# Patient Record
Sex: Female | Born: 1995 | Race: White | Hispanic: No | Marital: Married | Smoking: Never smoker
Health system: Southern US, Community
[De-identification: ages and names within clinical notes are randomized; demographics above are authoritative.]

## PROBLEM LIST (undated history)

## (undated) ENCOUNTER — Emergency Department (HOSPITAL_COMMUNITY): Discharge status: Absent without leave | Payer: BC Managed Care – PPO

## (undated) DIAGNOSIS — M5126 Other intervertebral disc displacement, lumbar region: Secondary | ICD-10-CM

## (undated) DIAGNOSIS — Z789 Other specified health status: Secondary | ICD-10-CM

## (undated) HISTORY — PX: WISDOM TOOTH EXTRACTION: SHX21

## (undated) HISTORY — DX: Other intervertebral disc displacement, lumbar region: M51.26

## (undated) HISTORY — DX: Other specified health status: Z78.9

---
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
## 2017-10-07 ENCOUNTER — Encounter: Payer: Self-pay | Admitting: Adult Health

## 2017-10-07 ENCOUNTER — Ambulatory Visit (INDEPENDENT_AMBULATORY_CARE_PROVIDER_SITE_OTHER): Payer: No Typology Code available for payment source | Admitting: Adult Health

## 2017-10-07 VITALS — BP 90/60 | HR 97 | Ht 62.0 in | Wt 159.4 lb

## 2017-10-07 DIAGNOSIS — Z3009 Encounter for other general counseling and advice on contraception: Secondary | ICD-10-CM

## 2017-10-07 DIAGNOSIS — Z3202 Encounter for pregnancy test, result negative: Secondary | ICD-10-CM

## 2017-10-07 LAB — POCT URINE PREGNANCY: Preg Test, Ur: NEGATIVE

## 2017-10-07 NOTE — Progress Notes (Signed)
Subjective:     Patient ID: Stefani Damahasity B Bailey, female   DOB: 12/23/1995, 21 y.o.   MRN: [id number]  HPI Forde DandyChasity is a 21 year old white female in to discuss birth control options.She is having sex with condoms.   Review of Systems  Having sex with condoms  Reviewed past medical,surgical, social and family history. Reviewed medications and allergies.     Objective:   Physical Exam BP 90/60 (BP Location: Right Arm, Patient Position: Sitting, Cuff Size: Small)   Pulse 97   Ht 5\' 2"  (1.575 m)   Wt 159 lb 6.4 oz (72.3 kg)   LMP 09/13/2017   BMI 29.15 kg/m UPT negative.Skin warm and dry.  Lungs: clear to ausculation bilaterally. Cardiovascular: regular rate and rhythm. PHQ 2 score 0. Discussed OCs,depo, IUD and nexplanon and she wants nexplanon.    Assessment:     1. Contraceptive education   2. Pregnancy examination or test, negative result       Plan:     Check stat QHCG 12/12 in am Order nexplanon today No sex after 10/12/17 Return 12/12 in pm for nexplanon insertion  Review handout on nexplanon

## 2017-10-07 NOTE — Patient Instructions (Signed)
Get labs 11/12 in am No sex after 10/12/17 Return 12/12 in pm for nexplanon insertion Etonogestrel implant What is this medicine? ETONOGESTREL (et oh noe JES trel) is a contraceptive (birth control) device. It is used to prevent pregnancy. It can be used for up to 3 years. This medicine may be used for other purposes; ask your health care provider or pharmacist if you have questions. COMMON BRAND NAME(S): Implanon, Nexplanon What should I tell my health care provider before I take this medicine? They need to know if you have any of these conditions: -abnormal vaginal bleeding -blood vessel disease or blood clots -cancer of the breast, cervix, or liver -depression -diabetes -gallbladder disease -headaches -heart disease or recent heart attack -high blood pressure -high cholesterol -kidney disease -liver disease -renal disease -seizures -tobacco smoker -an unusual or allergic reaction to etonogestrel, other hormones, anesthetics or antiseptics, medicines, foods, dyes, or preservatives -pregnant or trying to get pregnant -breast-feeding How should I use this medicine? This device is inserted just under the skin on the inner side of your upper arm by a health care professional. Talk to your pediatrician regarding the use of this medicine in children. Special care may be needed. Overdosage: If you think you have taken too much of this medicine contact a poison control center or emergency room at once. NOTE: This medicine is only for you. Do not share this medicine with others. What if I miss a dose? This does not apply. What may interact with this medicine? Do not take this medicine with any of the following medications: -amprenavir -bosentan -fosamprenavir This medicine may also interact with the following medications: -barbiturate medicines for inducing sleep or treating seizures -certain medicines for fungal infections like ketoconazole and itraconazole -grapefruit  juice -griseofulvin -medicines to treat seizures like carbamazepine, felbamate, oxcarbazepine, phenytoin, topiramate -modafinil -phenylbutazone -rifampin -rufinamide -some medicines to treat HIV infection like atazanavir, indinavir, lopinavir, nelfinavir, tipranavir, ritonavir -St. John's wort This list may not describe all possible interactions. Give your health care provider a list of all the medicines, herbs, non-prescription drugs, or dietary supplements you use. Also tell them if you smoke, drink alcohol, or use illegal drugs. Some items may interact with your medicine. What should I watch for while using this medicine? This product does not protect you against HIV infection (AIDS) or other sexually transmitted diseases. You should be able to feel the implant by pressing your fingertips over the skin where it was inserted. Contact your doctor if you cannot feel the implant, and use a non-hormonal birth control method (such as condoms) until your doctor confirms that the implant is in place. If you feel that the implant may have broken or become bent while in your arm, contact your healthcare provider. What side effects may I notice from receiving this medicine? Side effects that you should report to your doctor or health care professional as soon as possible: -allergic reactions like skin rash, itching or hives, swelling of the face, lips, or tongue -breast lumps -changes in emotions or moods -depressed mood -heavy or prolonged menstrual bleeding -pain, irritation, swelling, or bruising at the insertion site -scar at site of insertion -signs of infection at the insertion site such as fever, and skin redness, pain or discharge -signs of pregnancy -signs and symptoms of a blood clot such as breathing problems; changes in vision; chest pain; severe, sudden headache; pain, swelling, warmth in the leg; trouble speaking; sudden numbness or weakness of the face, arm or leg -signs and symptoms  of liver injury like dark yellow or brown urine; general ill feeling or flu-like symptoms; light-colored stools; loss of appetite; nausea; right upper belly pain; unusually weak or tired; yellowing of the eyes or skin -unusual vaginal bleeding, discharge -signs and symptoms of a stroke like changes in vision; confusion; trouble speaking or understanding; severe headaches; sudden numbness or weakness of the face, arm or leg; trouble walking; dizziness; loss of balance or coordination Side effects that usually do not require medical attention (report to your doctor or health care professional if they continue or are bothersome): -acne -back pain -breast pain -changes in weight -dizziness -general ill feeling or flu-like symptoms -headache -irregular menstrual bleeding -nausea -sore throat -vaginal irritation or inflammation This list may not describe all possible side effects. Call your doctor for medical advice about side effects. You may report side effects to FDA at 1-800-FDA-1088. Where should I keep my medicine? This drug is given in a hospital or clinic and will not be stored at home. NOTE: This sheet is a summary. It may not cover all possible information. If you have questions about this medicine, talk to your doctor, pharmacist, or health care provider.  2018 Elsevier/Gold Standard (2016-05-23 11:19:22)

## 2017-10-29 ENCOUNTER — Encounter: Payer: No Typology Code available for payment source | Admitting: Adult Health

## 2017-11-20 ENCOUNTER — Emergency Department (HOSPITAL_COMMUNITY)
Admission: EM | Admit: 2017-11-20 | Discharge: 2017-11-20 | Disposition: A | Discharge status: Home / Self Care | Payer: Self-pay | Attending: Emergency Medicine | Admitting: Emergency Medicine

## 2017-11-20 ENCOUNTER — Encounter (HOSPITAL_COMMUNITY): Payer: Self-pay | Admitting: *Deleted

## 2017-11-20 DIAGNOSIS — Z041 Encounter for examination and observation following transport accident: Secondary | ICD-10-CM | POA: Insufficient documentation

## 2017-11-20 MED ORDER — IBUPROFEN 400 MG PO TABS
400.0000 mg | ORAL_TABLET | Freq: Once | ORAL | Status: DC | PRN
  Filled 2017-11-20: qty 1
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
## 2017-11-20 NOTE — ED Notes (Signed)
Pt c/o back pain to registration clerk. Brought patient back for re-eval, ambulatory to triage, stating back pain is coming and going. Offered protocol pain med, pt says she does not want to take any at this time.

## 2017-12-30 ENCOUNTER — Encounter: Payer: No Typology Code available for payment source | Admitting: Adult Health

## 2017-12-30 ENCOUNTER — Other Ambulatory Visit: Payer: Medicaid Other

## 2018-04-08 ENCOUNTER — Ambulatory Visit (INDEPENDENT_AMBULATORY_CARE_PROVIDER_SITE_OTHER): Payer: Medicaid Other | Admitting: Adult Health

## 2018-04-08 ENCOUNTER — Encounter: Payer: Self-pay | Admitting: Adult Health

## 2018-04-08 ENCOUNTER — Other Ambulatory Visit: Payer: Medicaid Other

## 2018-04-08 VITALS — BP 140/90 | HR 86 | Ht 62.0 in | Wt 171.5 lb

## 2018-04-08 DIAGNOSIS — Z113 Encounter for screening for infections with a predominantly sexual mode of transmission: Secondary | ICD-10-CM

## 2018-04-08 DIAGNOSIS — Z01419 Encounter for gynecological examination (general) (routine) without abnormal findings: Secondary | ICD-10-CM

## 2018-04-08 DIAGNOSIS — Z308 Encounter for other contraceptive management: Secondary | ICD-10-CM

## 2018-04-08 DIAGNOSIS — Z309 Encounter for contraceptive management, unspecified: Secondary | ICD-10-CM | POA: Diagnosis not present

## 2018-04-08 DIAGNOSIS — Z3009 Encounter for other general counseling and advice on contraception: Secondary | ICD-10-CM | POA: Insufficient documentation

## 2018-04-08 LAB — BETA HCG QUANT (REF LAB)

## 2018-04-08 NOTE — Progress Notes (Signed)
Patient ID: Jill [name], female   DOB: [birth year], 22 y.o.   MRN: [id number] History of Present Illness: Jenavive is a 22 year old white female in for well woman gyn exam with family planning medicaid.She was supposed to get nexplanon today but had to reschule  that til after physical. PCP is Dr Phillips Odor.    Current Medications, Allergies, Past Medical History, Past Surgical History, Family History and Social History were reviewed in [employer] record.     Review of Systems: Patient denies any headaches, hearing loss, fatigue, blurred vision, shortness of breath, chest pain, abdominal pain, problems with bowel movements, urination, or intercourse. No joint pain or mood swings.    Physical Exam:BP 140/90 (BP Location: Left Arm, Patient Position: Sitting, Cuff Size: Normal)   Pulse 86   Ht  (1.575 m)   Wt 171 lb 8 oz (77.8 kg)   LMP 03/24/2018   BMI 31.37 kg/m QHCG <1 this am. General:  Well developed, well nourished, no acute distress Skin:  Warm and dry,has mild sunburn  Neck:  Midline trachea, normal thyroid, good ROM, no lymphadenopathy Lungs; Clear to auscultation bilaterally Breast:  No dominant palpable mass, retraction, or nipple discharge Cardiovascular: Regular rate and rhythm Abdomen:  Soft, non tender, no hepatosplenomegaly Pelvic:  External genitalia is normal in appearance, no lesions.  The vagina is normal in appearance. Urethra has no lesions or masses. The cervix is smooth, GC/CHL obtained.  Uterus is felt to be normal size, shape, and contour.  No adnexal masses or tenderness noted.Bladder is non tender, no masses felt. Extremities/musculoskeletal:  No swelling or varicosities noted, no clubbing or cyanosis Psych:  No mood changes, alert and cooperative,seems happy PHQ 2 score 0.  Impression: 1. Encounter for well woman exam with routine gynecological exam   2. Family planning   3. Screening examination for STD (sexually transmitted  disease)       Plan: GC/CHL sent Check HIV and RPR Return in 1 day for nexplanon insertion No sex tonight or tomorrow Physical in 1 year Pap at 2

## 2018-04-09 ENCOUNTER — Encounter: Payer: Self-pay | Admitting: Adult Health

## 2018-04-09 ENCOUNTER — Ambulatory Visit (INDEPENDENT_AMBULATORY_CARE_PROVIDER_SITE_OTHER): Payer: Medicaid Other | Admitting: Adult Health

## 2018-04-09 VITALS — BP 112/70 | HR 75 | Ht 62.0 in | Wt 170.5 lb

## 2018-04-09 DIAGNOSIS — Z30017 Encounter for initial prescription of implantable subdermal contraceptive: Secondary | ICD-10-CM

## 2018-04-09 DIAGNOSIS — Z3049 Encounter for surveillance of other contraceptives: Secondary | ICD-10-CM

## 2018-04-09 LAB — RPR: RPR Ser Ql: NONREACTIVE

## 2018-04-09 LAB — HIV ANTIBODY (ROUTINE TESTING W REFLEX): HIV Screen 4th Generation wRfx: NONREACTIVE

## 2018-04-09 MED ORDER — ETONOGESTREL 68 MG ~~LOC~~ IMPL
68.0000 mg | DRUG_IMPLANT | Freq: Once | SUBCUTANEOUS | Status: AC
  Administered 2018-04-09: 68 mg via SUBCUTANEOUS

## 2018-04-09 NOTE — Progress Notes (Signed)
  Subjective:     Patient ID: Jill [name], female   DOB: [birth year], 22 y.o.   MRN: [id number]  HPI Jill [name] is a 22 year old white female in for nexplanon insertion.   Review of Systems For nexplanon insertion Reviewed past medical,surgical, social and family history. Reviewed medications and allergies.     Objective:   Physical Exam BP 112/70 (BP Location: Left Arm, Patient Position: Sitting, Cuff Size: Normal)   Pulse 75   Ht  (1.575 m)   Wt 170 lb 8 oz (77.3 kg)   LMP 03/24/2018   BMI 31.18 kg/m QHCG <1 yesterday, no sex last night. Consent signed, time out called. Left arm cleansed with betadine, and injected with 1.5 cc 1% lidocaine and waited til numb. Nexplanon easily inserted and steri strips applied.Rod easily palpated by provider and pt. Pressure dressing applied.    Assessment:     Nexplanon insertion Lot # [id number] exp 3/21    Plan:     Use condoms x 2-4 weeks, keep clean and dry x 24 hours, no heavy lifting, keep steri strips on x 72 hours, Keep pressure dressing on x 24 hours. Follow up prn problems.

## 2018-04-09 NOTE — Patient Instructions (Signed)
Use condoms x 2 weeks, keep clean and dry x 24 hours, no heavy lifting, keep steri strips on x 72 hours, Keep pressure dressing on x 24 hours. Follow up prn problems.  

## 2018-04-09 NOTE — Addendum Note (Signed)
Addended by: Colen Darling on: 04/09/2018 04:32 PM   Modules accepted: Orders

## 2018-04-10 LAB — GC/CHLAMYDIA PROBE AMP
Chlamydia trachomatis, NAA: NEGATIVE
Neisseria gonorrhoeae by PCR: NEGATIVE

## 2018-07-23 ENCOUNTER — Ambulatory Visit (INDEPENDENT_AMBULATORY_CARE_PROVIDER_SITE_OTHER): Payer: Medicaid Other | Admitting: Adult Health

## 2018-07-23 ENCOUNTER — Encounter: Payer: Self-pay | Admitting: Adult Health

## 2018-07-23 VITALS — BP 120/76 | HR 77 | Ht 62.0 in | Wt 160.0 lb

## 2018-07-23 DIAGNOSIS — N921 Excessive and frequent menstruation with irregular cycle: Secondary | ICD-10-CM

## 2018-07-23 DIAGNOSIS — Z113 Encounter for screening for infections with a predominantly sexual mode of transmission: Secondary | ICD-10-CM

## 2018-07-23 DIAGNOSIS — Z975 Presence of (intrauterine) contraceptive device: Secondary | ICD-10-CM | POA: Diagnosis not present

## 2018-07-23 DIAGNOSIS — Z3202 Encounter for pregnancy test, result negative: Secondary | ICD-10-CM | POA: Diagnosis not present

## 2018-07-23 DIAGNOSIS — N644 Mastodynia: Secondary | ICD-10-CM | POA: Insufficient documentation

## 2018-07-23 LAB — POCT URINE PREGNANCY: Preg Test, Ur: NEGATIVE

## 2018-07-23 MED ORDER — MEGESTROL ACETATE 40 MG PO TABS
ORAL_TABLET | ORAL | 1 refills | Status: DC
Start: 2018-07-23 — End: 2019-10-30

## 2018-07-23 NOTE — Progress Notes (Addendum)
  Subjective:     Patient ID: Jill [name], female   DOB: [birth year], 22 y.o.   MRN: [id number]  HPI Jill [name] is a 22 year old white female in complaining of irregular bleeding since having nexplanon inserted, and left breast bigger than right and is sore at times.  Boyfriend cheated.  Review of Systems Irregular bleeding with nexplanon Left breast sore at times and is bigger that right  Reviewed past medical,surgical, social and family history. Reviewed medications and allergies.     Objective:   Physical Exam BP 120/76 (BP Location: Left Arm, Patient Position: Sitting, Cuff Size: Normal)   Pulse 77   Ht 5\' 2"  (1.575 m)   Wt 160 lb (72.6 kg)   LMP 07/17/2018   BMI 29.26 kg/m UPT negative.  Skin warm and dry,  Breasts:no dominate palpable mass, retraction or nipple discharge, L>R Pelvic: external genitalia is normal in appearance no lesions, vagina:pink with good moisture and rugae,,urethra has no lesions or masses noted, cervix:smooth,scant blood at os, uterus: normal size, shape and contour, non tender, no masses felt, adnexa: no masses or tenderness noted. Bladder is non tender and no masses felt. GC/CHL sent on urine.  Nexplanon easily palpated left arm. Examination chaperoned by Malachy Mood LPN.     Assessment:     1. Irregular intermenstrual bleeding   2. Soreness breast   3. Nexplanon in place   4. Pregnancy examination or test, negative result   5. Screening examination for STD (sexually transmitted disease)       Plan:    GC/CHL sent on urine  Use condoms  Meds ordered this encounter  Medications  . megestrol (MEGACE) 40 MG tablet    Sig: Take 2 daily    Dispense:  60 tablet    Refill:  1    Order Specific Question:   Supervising Provider    Answer:   Duane Lope H [[id number]]  F/U prn

## 2018-07-24 LAB — GC/CHLAMYDIA PROBE AMP
CHLAMYDIA, DNA PROBE: NEGATIVE
NEISSERIA GONORRHOEAE BY PCR: NEGATIVE

## 2019-07-24 ENCOUNTER — Encounter (HOSPITAL_COMMUNITY): Payer: Self-pay | Admitting: *Deleted

## 2019-07-24 ENCOUNTER — Other Ambulatory Visit: Payer: Self-pay

## 2019-07-24 ENCOUNTER — Emergency Department (HOSPITAL_COMMUNITY)
Admission: EM | Admit: 2019-07-24 | Discharge: 2019-07-24 | Disposition: A | Discharge status: Home / Self Care | Payer: BC Managed Care – PPO | Attending: Emergency Medicine | Admitting: Emergency Medicine

## 2019-07-24 DIAGNOSIS — Y939 Activity, unspecified: Secondary | ICD-10-CM | POA: Insufficient documentation

## 2019-07-24 DIAGNOSIS — S70362A Insect bite (nonvenomous), left thigh, initial encounter: Secondary | ICD-10-CM

## 2019-07-24 DIAGNOSIS — Y999 Unspecified external cause status: Secondary | ICD-10-CM | POA: Diagnosis not present

## 2019-07-24 DIAGNOSIS — Y929 Unspecified place or not applicable: Secondary | ICD-10-CM | POA: Insufficient documentation

## 2019-07-24 DIAGNOSIS — W57XXXA Bitten or stung by nonvenomous insect and other nonvenomous arthropods, initial encounter: Secondary | ICD-10-CM | POA: Diagnosis not present

## 2019-07-24 MED ORDER — PREDNISONE 50 MG PO TABS: ORAL_TABLET | ORAL | 0 refills | Status: DC

## 2019-07-24 MED ORDER — PREDNISONE 50 MG PO TABS
60.0000 mg | ORAL_TABLET | Freq: Once | ORAL | Status: AC
  Administered 2019-07-24: 60 mg via ORAL
  Filled 2019-07-24: qty 1

## 2019-07-24 NOTE — ED Triage Notes (Signed)
Pt noticed itching to back of left thigh earlier today, redness noted and has gotten worse over the day.  Pt took children's benadryl and benadryl cream.

## 2019-07-24 NOTE — Discharge Instructions (Signed)
Continue benadryl 25 mg every 4 hours

## 2019-07-24 NOTE — ED Provider Notes (Signed)
[facility] EMERGENCY DEPARTMENT Provider Note   CSN: [id number] Arrival date & time: 07/24/19  1818     History   Chief Complaint Chief Complaint  Patient presents with  . Insect Bite    HPI Jill [name] is a 23 y.o. female.     The history is provided by the patient. No language interpreter was used.  Rash Location:  Leg Leg rash location:  L upper leg Quality: painful and swelling   Pain details:    Quality:  Aching   Severity:  Moderate   Onset quality:  Gradual   Timing:  Constant   Progression:  Worsening Severity:  Moderate Onset quality:  Gradual Timing:  Constant Chronicity:  New Relieved by:  Nothing Worsened by:  Nothing Associated symptoms: no fever   Pt complains of an insect bite on her left leg.  Pt reports he had asmall area of swelling that has increased.    History reviewed. No pertinent past medical history.  Patient Active Problem List   Diagnosis Date Noted  . Irregular intermenstrual bleeding 07/23/2018  . Soreness breast 07/23/2018  . Pregnancy examination or test, negative result 07/23/2018  . Nexplanon in place 07/23/2018  . Screening examination for STD (sexually transmitted disease) 04/08/2018  . Family planning 04/08/2018  . Encounter for well woman exam with routine gynecological exam 04/08/2018    History reviewed. No pertinent surgical history.   OB History    Gravida  0   Para  0   Term  0   Preterm  0   AB  0   Living  0     SAB  0   TAB  0   Ectopic  0   Multiple  0   Live Births  0            Home Medications    Prior to Admission medications   Medication Sig Start Date End Date Taking? Authorizing Provider  etonogestrel (NEXPLANON) 68 MG IMPL implant 1 each by Subdermal route once.    [provider]  megestrol (MEGACE) 40 MG tablet Take 2 daily 07/23/18   Adline PotterGriffin, Jennifer A, NP  predniSONE (DELTASONE) 50 MG tablet One tablet a day for 4 days 07/24/19   Elson AreasSofia, Leslie K, PA-C     Family History Family History  Problem Relation Age of Onset  . Cancer Maternal Grandfather        lung    Social History Social History   Tobacco Use  . Smoking status: Never Smoker  . Smokeless tobacco: Never Used  Substance Use Topics  . Alcohol use: No    Frequency: Never  . Drug use: No     Allergies   Patient has no known allergies.   Review of Systems Review of Systems  Constitutional: Negative for fever.  Skin: Positive for color change and rash.  All other systems reviewed and are negative.    Physical Exam Updated Vital Signs BP 117/77 (BP Location: Left Arm)   Pulse 93   Temp 99.3 F (37.4 C) (Oral)   Resp 12   Ht 5\' 2"  (1.575 m)   Wt 77.1 kg   LMP 07/17/2019   SpO2 100%   BMI 31.09 kg/m   Physical Exam Vitals signs reviewed.  Cardiovascular:     Rate and Rhythm: Normal rate.  Pulmonary:     Effort: Pulmonary effort is normal.  Skin:    General: Skin is warm.     Findings: Erythema  present.     Comments: 25cm round area left upper leg,  Warm to touch  Neurological:     General: No focal deficit present.     Mental Status: She is alert.  Psychiatric:        Mood and Affect: Mood normal.      ED Treatments / Results  Labs (all labs ordered are listed, but only abnormal results are displayed) Labs Reviewed - No data to display  EKG None  Radiology No results found.  Procedures Procedures (including critical care time)  Medications Ordered in ED Medications  predniSONE (DELTASONE) tablet 60 mg (60 mg Oral Given 07/24/19 1842)     Initial Impression / Assessment and Plan / ED Course  I have reviewed the triage vital signs and the nursing notes.  Pertinent labs & imaging results that were available during my care of the patient were reviewed by me and considered in my medical decision making (see chart for details).        MDM  Pt has been taking benadryl.  Pt given rx for prednisone.  Pt given prednisone here.    Final Clinical Impressions(s) / ED Diagnoses   Final diagnoses:  Insect bite of left thigh, initial encounter    ED Discharge Orders         Ordered    predniSONE (DELTASONE) 50 MG tablet     07/24/19 1853        An After Visit Summary was printed and given to the patient.    Fransico Meadow, Hershal Coria 07/24/19 2048    Maudie Flakes, MD 07/28/19 [phone]

## 2019-10-30 ENCOUNTER — Encounter: Payer: Self-pay | Admitting: Emergency Medicine

## 2019-10-30 ENCOUNTER — Ambulatory Visit
Admission: EM | Admit: 2019-10-30 | Discharge: 2019-10-30 | Disposition: A | Discharge status: Home / Self Care | Payer: BC Managed Care – PPO | Attending: Emergency Medicine | Admitting: Emergency Medicine

## 2019-10-30 ENCOUNTER — Other Ambulatory Visit: Payer: Self-pay

## 2019-10-30 DIAGNOSIS — N3001 Acute cystitis with hematuria: Secondary | ICD-10-CM | POA: Diagnosis not present

## 2019-10-30 DIAGNOSIS — Z3202 Encounter for pregnancy test, result negative: Secondary | ICD-10-CM

## 2019-10-30 DIAGNOSIS — R109 Unspecified abdominal pain: Secondary | ICD-10-CM

## 2019-10-30 LAB — POCT URINE PREGNANCY: Preg Test, Ur: NEGATIVE

## 2019-10-30 LAB — POCT URINALYSIS DIP (MANUAL ENTRY)
Bilirubin, UA: NEGATIVE
Glucose, UA: NEGATIVE mg/dL
Ketones, POC UA: NEGATIVE mg/dL
Leukocytes, UA: NEGATIVE
Nitrite, UA: NEGATIVE
Spec Grav, UA: >=1.03 — AB (ref 1.010–1.025)
Urobilinogen, UA: 1 E.U./dL
pH, UA: 6 (ref 5.0–8.0)

## 2019-10-30 MED ORDER — NITROFURANTOIN MONOHYD MACRO 100 MG PO CAPS: 100.0000 mg | ORAL_CAPSULE | Freq: Two times a day (BID) | ORAL | 0 refills | Status: DC

## 2019-10-30 MED ORDER — PHENAZOPYRIDINE HCL 200 MG PO TABS: 200.0000 mg | ORAL_TABLET | Freq: Three times a day (TID) | ORAL | 0 refills | Status: DC

## 2019-10-30 NOTE — ED Provider Notes (Signed)
[facility] CARE CENTER   [id number] 10/30/19 Arrival Time: 1340  CC: ABDOMINAL DISCOMFORT  SUBJECTIVE:  Jill [name] is a 23 y.o. female who presents with complaint of abdominal discomfort that began 3 days ago.  Denies a precipitating event, trauma, close contacts with similar symptoms, recent travel, antibiotic use, delayed bathroom breaks, excessive caffeine, or recent sexual activity.  Last sex in January of this year.  Localizes discomfort to lower abdomen.  Describes as intermittent and "pressure" in character, but not severe.   Has not tried OTC medications.  Denies aggravating factors.  Denies similar symptoms in the past.  Reports associated fatigue, nausea, decreased urinary frequency, thick yellow vaginal discharge, and a few episodes of diarrhea 2-3 days ago.    Denies fever, chills, vomiting, chest pain, SOB, constipation, hematochezia, melena, dysuria, difficulty urinating, flank pain, loss of bowel or bladder function, vaginal odor, vaginal itching.     Patient's last menstrual period was 07/08/2019. Unsure of LMP, has nexplanon.    ROS: As per HPI.  All other pertinent ROS negative.     History reviewed. No pertinent past medical history. History reviewed. No pertinent surgical history. No Known Allergies No current facility-administered medications on file prior to encounter.   Current Outpatient Medications on File Prior to Encounter  Medication Sig Dispense Refill  . [DISCONTINUED] etonogestrel (NEXPLANON) 68 MG IMPL implant 1 each by Subdermal route once.     Social History   Socioeconomic History  . Marital status: Single    Spouse name: Not on file  . Number of children: Not on file  . Years of education: Not on file  . Highest education level: Not on file  Occupational History  . Not on file  Tobacco Use  . Smoking status: Never Smoker  . Smokeless tobacco: Never Used  Substance and Sexual Activity  . Alcohol use: Yes    Comment: occasional  .  Drug use: No  . Sexual activity: Not Currently    Birth control/protection: Condom, Implant  Other Topics Concern  . Not on file  Social History Narrative  . Not on file   Social Determinants of Health   Financial Resource Strain:   . Difficulty of Paying Living Expenses: Not on file  Food Insecurity:   . Worried About Programme researcher, broadcasting/film/video in the Last Year: Not on file  . Ran Out of Food in the Last Year: Not on file  Transportation Needs:   . Lack of Transportation (Medical): Not on file  . Lack of Transportation (Non-Medical): Not on file  Physical Activity:   . Days of Exercise per Week: Not on file  . Minutes of Exercise per Session: Not on file  Stress:   . Feeling of Stress : Not on file  Social Connections:   . Frequency of Communication with Friends and Family: Not on file  . Frequency of Social Gatherings with Friends and Family: Not on file  . Attends Religious Services: Not on file  . Active Member of Clubs or Organizations: Not on file  . Attends Banker Meetings: Not on file  . Marital Status: Not on file  Intimate Partner Violence:   . Fear of Current or Ex-Partner: Not on file  . Emotionally Abused: Not on file  . Physically Abused: Not on file  . Sexually Abused: Not on file   Family History  Problem Relation Age of Onset  . Cancer Maternal Grandfather        lung  .  Gallbladder disease Father   . Diverticulitis Mother      OBJECTIVE:  Vitals:   10/30/19 1436  BP: 129/73  Pulse: 85  Resp: 16  Temp: 98.7 F (37.1 C)  TempSrc: Oral  SpO2: 98%    General appearance: Alert; NAD HEENT: NCAT.  Oropharynx clear.  Lungs: clear to auscultation bilaterally without adventitious breath sounds Heart: regular rate and rhythm.   Abdomen: soft, non-distended; normal active bowel sounds; mildly TTP over suprapubic and LLQ; nontender at McBurney's point; negative Murphy's sign; no guarding Back: no CVA tenderness Extremities: no edema;  symmetrical with no gross deformities Skin: warm and dry Neurologic: normal gait Psychological: alert and cooperative; normal mood and affect  LABS: Results for orders placed or performed during the hospital encounter of 10/30/19 (from the past 24 hour(s))  POCT urinalysis dipstick     Status: Abnormal   Collection Time: 10/30/19  2:26 PM  Result Value Ref Range   Color, UA yellow yellow   Clarity, UA clear clear   Glucose, UA negative negative mg/dL   Bilirubin, UA negative negative   Ketones, POC UA negative negative mg/dL   Spec Grav, UA >=1.030 (A) 1.010 - 1.025   Blood, UA trace-intact (A) negative   pH, UA 6.0 5.0 - 8.0   Protein Ur, POC trace (A) negative mg/dL   Urobilinogen, UA 1.0 0.2 or 1.0 E.U./dL   Nitrite, UA Negative Negative   Leukocytes, UA Negative Negative  POCT urine pregnancy     Status: None   Collection Time: 10/30/19  2:26 PM  Result Value Ref Range   Preg Test, Ur Negative Negative    ASSESSMENT & PLAN:  1. Abdominal pressure   2. Acute cystitis with hematuria     Meds ordered this encounter  Medications  . nitrofurantoin, macrocrystal-monohydrate, (MACROBID) 100 MG capsule    Sig: Take 1 capsule (100 mg total) by mouth 2 (two) times daily.    Dispense:  10 capsule    Refill:  0    Order Specific Question:   Supervising Provider    Answer:   Raylene Everts [[id number]]  . phenazopyridine (PYRIDIUM) 200 MG tablet    Sig: Take 1 tablet (200 mg total) by mouth 3 (three) times daily.    Dispense:  6 tablet    Refill:  0    Order Specific Question:   Supervising Provider    Answer:   Raylene Everts [[id number]]   Declines vaginal swab today Urine showed trace blood.  This may be a sign of UTI Urine culture sent.  We will call you with abnormal results.   Push fluids and get plenty of rest.   Take antibiotic as directed and to completion Take pyridium as prescribed and as needed for symptomatic relief Follow up with PCP if symptoms  persists Return here or go to ER if you have any new or worsening symptoms such as fever, worsening abdominal pain, nausea/vomiting, flank pain, symptoms do not improve with medications, etc...  Reviewed expectations re: course of current medical issues. Questions answered. Outlined signs and symptoms indicating need for more acute intervention. Patient verbalized understanding. After Visit Summary given.   Lestine Box, PA-C 10/30/19 1505

## 2019-10-30 NOTE — Discharge Instructions (Signed)
Urine showed trace blood.  This may be a sign of UTI Urine culture sent.  We will call you with abnormal results.   Push fluids and get plenty of rest.   Take antibiotic as directed and to completion Take pyridium as prescribed and as needed for symptomatic relief Follow up with PCP if symptoms persists Return here or go to ER if you have any new or worsening symptoms such as fever, worsening abdominal pain, nausea/vomiting, flank pain, symptoms do not improve with medications, etc..Marland Kitchen

## 2019-10-30 NOTE — ED Triage Notes (Signed)
Pt presents to UC w/ c/o UTI sx x3 days.

## 2019-11-01 LAB — URINE CULTURE: Culture: NO GROWTH

## 2020-03-22 ENCOUNTER — Other Ambulatory Visit: Payer: Self-pay

## 2020-03-22 ENCOUNTER — Ambulatory Visit (INDEPENDENT_AMBULATORY_CARE_PROVIDER_SITE_OTHER): Payer: BC Managed Care – PPO | Admitting: Advanced Practice Midwife

## 2020-03-22 ENCOUNTER — Encounter: Payer: Self-pay | Admitting: Advanced Practice Midwife

## 2020-03-22 VITALS — BP 111/75 | HR 81 | Ht 62.0 in | Wt 182.5 lb

## 2020-03-22 DIAGNOSIS — R319 Hematuria, unspecified: Secondary | ICD-10-CM

## 2020-03-22 DIAGNOSIS — N898 Other specified noninflammatory disorders of vagina: Secondary | ICD-10-CM

## 2020-03-22 DIAGNOSIS — Z113 Encounter for screening for infections with a predominantly sexual mode of transmission: Secondary | ICD-10-CM | POA: Diagnosis not present

## 2020-03-22 LAB — POCT URINALYSIS DIPSTICK
Glucose, UA: NEGATIVE
Ketones, UA: NEGATIVE
Leukocytes, UA: NEGATIVE
Nitrite, UA: NEGATIVE
Protein, UA: NEGATIVE

## 2020-03-22 NOTE — Progress Notes (Signed)
   GYN VISIT Patient name: Jill [name] MRN [id number]  Date of birth: [birth year] Chief Complaint:   spotting with Nexplanon (pain when wiping)  History of Present Illness:   Jill [name] is a 24 y.o. G0P0000 Caucasian female being seen today for concern over spotting starting last week, mostly brown, but also some general vag irritation. Denies frequency or urgency with urination; questionable dysuria. No vag d/c other than the brown spotting.     Depression screen [facility] 2/9 04/08/2018 10/07/2017  Decreased Interest 0 0  Down, Depressed, Hopeless 0 0  PHQ - 2 Score 0 0    Patient's last menstrual period was 03/07/2020. The current method of family planning is Nexplanon.  Last pap hasn't had yet.  Review of Systems:   Pertinent items are noted in HPI Denies fever/chills, dizziness, headaches, visual disturbances, fatigue, shortness of breath, chest pain, abdominal pain, vomiting, abnormal vaginal discharge/itching/odor/irritation, problems with periods, bowel movements, urination, or intercourse unless otherwise stated above.  Pertinent History Reviewed:  Reviewed past medical,surgical, social, obstetrical and family history.  Reviewed problem list, medications and allergies. Physical Assessment:   Vitals:   03/22/20 1417  BP: 111/75  Pulse: 81  Weight: 182 lb 8 oz (82.8 kg)  Height: 5\' 2"  (1.575 m)  Body mass index is 33.38 kg/m.       Physical Examination:   General appearance: alert, well appearing, and in no distress  Mental status: alert, oriented to person, place, and time  Skin: warm & dry   Cardiovascular: normal heart rate noted  Respiratory: normal respiratory effort, no distress  Abdomen: soft, non-tender   Pelvic: normal external genitalia, vulva, vagina, cervix, uterus and adnexa; sm brown d/c in vault  Extremities: no edema   Chaperone:    Results for orders placed or performed in visit on 03/22/20 (from the past 24 hour(s))  POCT  Urinalysis Dipstick   Collection Time: 03/22/20  2:46 PM  Result Value Ref Range   Color, UA     Clarity, UA     Glucose, UA Negative Negative   Bilirubin, UA     Ketones, UA neg    Spec Grav, UA     Blood, UA 2+    pH, UA     Protein, UA Negative Negative   Urobilinogen, UA     Nitrite, UA neg    Leukocytes, UA Negative Negative   Appearance     Odor      Assessment & Plan:  1) Vag irritation> will send NuSwab; reassured re spotting with the Nexplanon   Meds: No orders of the defined types were placed in this encounter.   Orders Placed This Encounter  Procedures  . NuSwab Vaginitis Plus (VG+)  . POCT Urinalysis Dipstick    Return for Pap & Physical at pt convenience.  05/22/20 CNM 03/22/2020 3:14 PM

## 2020-03-31 LAB — NUSWAB VAGINITIS PLUS (VG+)
Candida albicans, NAA: POSITIVE — AB
Candida glabrata, NAA: NEGATIVE
Chlamydia trachomatis, NAA: NEGATIVE
Neisseria gonorrhoeae, NAA: NEGATIVE
Trich vag by NAA: NEGATIVE

## 2020-03-31 LAB — SPECIMEN STATUS REPORT

## 2020-04-04 ENCOUNTER — Telehealth: Payer: Self-pay | Admitting: Women's Health

## 2020-04-04 NOTE — Telephone Encounter (Signed)
Pt states she received results through mychart message but is unable to respond, was wondering if someone could give her a call regarding pap results.

## 2020-04-05 ENCOUNTER — Other Ambulatory Visit: Payer: Self-pay | Admitting: Advanced Practice Midwife

## 2020-04-05 MED ORDER — FLUCONAZOLE 150 MG PO TABS: 150.0000 mg | ORAL_TABLET | Freq: Once | ORAL | 0 refills | Status: AC

## 2020-04-05 NOTE — Telephone Encounter (Signed)
Telephoned patient at home number and left message to return call.  

## 2020-04-05 NOTE — Telephone Encounter (Signed)
Pt returning your call

## 2020-04-05 NOTE — Telephone Encounter (Signed)
Telephoned patient at home number and advised patient test results did show yeast. Patient would prefer pill be called in for her. Advised to check with pharmacy later today. Patient voiced understanding.

## 2020-04-20 ENCOUNTER — Ambulatory Visit (INDEPENDENT_AMBULATORY_CARE_PROVIDER_SITE_OTHER): Payer: BC Managed Care – PPO | Admitting: Adult Health

## 2020-04-20 ENCOUNTER — Encounter: Payer: Self-pay | Admitting: Adult Health

## 2020-04-20 ENCOUNTER — Other Ambulatory Visit (HOSPITAL_COMMUNITY)
Admission: RE | Admit: 2020-04-20 | Discharge: 2020-04-20 | Disposition: A | Discharge status: Home / Self Care | Payer: BC Managed Care – PPO | Source: Ambulatory Visit | Attending: Adult Health | Admitting: Adult Health

## 2020-04-20 VITALS — BP 106/70 | HR 58 | Ht 62.0 in | Wt 181.8 lb

## 2020-04-20 DIAGNOSIS — Z1272 Encounter for screening for malignant neoplasm of vagina: Secondary | ICD-10-CM

## 2020-04-20 DIAGNOSIS — Z01419 Encounter for gynecological examination (general) (routine) without abnormal findings: Secondary | ICD-10-CM | POA: Insufficient documentation

## 2020-04-20 DIAGNOSIS — Z975 Presence of (intrauterine) contraceptive device: Secondary | ICD-10-CM | POA: Diagnosis not present

## 2020-04-20 NOTE — Progress Notes (Signed)
Patient ID: Jill [name], female   DOB: 03/04/1996, 24 y.o.   MRN: [id number] History of Present Illness: Jill [name] is a 24 year old white female, single, G0P0, in for well woman gyn exam and first pap. She has nexplanon, it was placed 04/09/18. PCP is [place].    Current Medications, Allergies, Past Medical History, Past Surgical History, Family History and Social History were reviewed in [employer] record.     Review of Systems: Patient denies any headaches, hearing loss, fatigue, blurred vision, shortness of breath, chest pain, abdominal pain, problems with bowel movements, urination, or intercourse(not currently active). No joint pain or mood swings.    Physical Exam:BP 106/70 (BP Location: Left Arm, Patient Position: Sitting, Cuff Size: Normal)   Pulse (!) 58   Ht 5\' 2"  (1.575 m)   Wt 181 lb 12.8 oz (82.5 kg)   BMI 33.25 kg/m  General:  Well developed, well nourished, no acute distress Skin:  Warm and dry Neck:  Midline trachea, normal thyroid, good ROM, no lymphadenopathy Lungs; Clear to auscultation bilaterally Breast:  No dominant palpable mass, retraction, or nipple discharge Cardiovascular: Regular rate and rhythm Abdomen:  Soft, non tender, no hepatosplenomegaly Pelvic:  External genitalia is normal in appearance, no lesions.  The vagina is normal in appearance. Urethra has no lesions or masses. The cervix is smooth and nulliparous, pap with GC/CHL performed. Uterus is felt to be normal size, shape, and contour.  No adnexal masses or tenderness noted.Bladder is non tender, no masses felt. Extremities/musculoskeletal:  No swelling or varicosities noted, no clubbing or cyanosis Psych:  No mood changes, alert and cooperative,seems happy AA 3 Fall risk is low PHQ 9 score is 2., denies any depression. Examination chaperoned by LPN  Impression and Plan:  1. Encounter for gynecological examination with Papanicolaou smear of cervix Pap  sent Physical in 1 year Pap in 3 if normal  2. Nexplanon in place Return about 04/05/21 for nexplanon and reinsetion

## 2020-04-24 LAB — CYTOLOGY - PAP
Chlamydia: NEGATIVE
Comment: NEGATIVE
Comment: NORMAL
Diagnosis: NEGATIVE
Neisseria Gonorrhea: NEGATIVE

## 2020-08-15 ENCOUNTER — Other Ambulatory Visit: Payer: Self-pay | Admitting: Women's Health

## 2020-08-15 MED ORDER — MEGESTROL ACETATE 40 MG PO TABS: ORAL_TABLET | ORAL | 1 refills | Status: DC

## 2020-08-30 ENCOUNTER — Telehealth: Payer: Self-pay

## 2020-08-30 NOTE — Telephone Encounter (Signed)
Pt has the Nexplanon and had bleeding for over a month. She was prescribed Megace. That stopped for 2 weeks then she started bleeding again. Pt has an appt to have Nexplanon removed on 11/1. She was unsure whether to start Megace back or just keep bleeding. Bleeding is light right now but she expects it to get heavy by the end of the week. I advised she can start Megace back but would need to start back taking 3x5d, 2x5d,  then 1 daily. Pt voiced understanding. JSY

## 2020-08-30 NOTE — Telephone Encounter (Signed)
Pt stated she was taking medication to stop her cycle and it lasted 2 weeks and has now started her cycle again. I have scheduled her an appointment to have her IUD removed on 11/01 and she is wanting to know about what she can do to have the Rx restarted. Please call pt and discuss her options

## 2020-09-06 ENCOUNTER — Other Ambulatory Visit: Payer: Self-pay | Admitting: Women's Health

## 2020-09-18 ENCOUNTER — Ambulatory Visit (INDEPENDENT_AMBULATORY_CARE_PROVIDER_SITE_OTHER): Payer: BC Managed Care – PPO | Admitting: Advanced Practice Midwife

## 2020-09-18 ENCOUNTER — Encounter: Payer: Self-pay | Admitting: Advanced Practice Midwife

## 2020-09-18 ENCOUNTER — Other Ambulatory Visit: Payer: Self-pay

## 2020-09-18 ENCOUNTER — Ambulatory Visit: Payer: BC Managed Care – PPO | Admitting: Adult Health

## 2020-09-18 VITALS — BP 128/79 | HR 73 | Ht 62.0 in | Wt 183.5 lb

## 2020-09-18 DIAGNOSIS — Z3046 Encounter for surveillance of implantable subdermal contraceptive: Secondary | ICD-10-CM

## 2020-09-18 DIAGNOSIS — R5383 Other fatigue: Secondary | ICD-10-CM

## 2020-09-18 LAB — POCT HEMOGLOBIN: Hemoglobin: 12.3 g/dL (ref 11–14.6)

## 2020-09-18 NOTE — Progress Notes (Addendum)
HPI:  Jill [name] 24 y.o. here for Nexplanon removal. It was placed 6/19 and she wants it removed d/t continued bleeding Her future plans for birth control are abstinence. .  Past Medical History: History reviewed. No pertinent past medical history.  Past Surgical History: History reviewed. No pertinent surgical history.  Family History: Family History  Problem Relation Age of Onset  . Cancer Maternal Grandfather        lung  . Gallbladder disease Father   . Diverticulitis Mother     Social History: Social History   Tobacco Use  . Smoking status: Never Smoker  . Smokeless tobacco: Never Used  Vaping Use  . Vaping Use: Never used  Substance Use Topics  . Alcohol use: Yes    Comment: occasional  . Drug use: No    Allergies: No Known Allergies  Meds: (Not in a hospital admission)     Patient given informed consent for removal of her Nexplanon, time out was performed.  Signed copy in the chart.  Appropriate time out taken. Implanon site identified.  Area prepped in usual sterile fashon. One cc of 1% lidocaine was used to anesthetize the area at the distal end of the implant. A small stab incision was made right beside the implant on the distal portion.  The Nexplanon rod was grasped using hemostats and removed without difficulty.  There was less than 3 cc blood loss. There were no complications.  A small amount of antibiotic ointment and steri-strips were applied over the small incision.  A pressure bandage was applied to reduce any bruising.  The patient tolerated the procedure well and was given post procedure instructions.   Has some fatigue and weakness when on period, so mom wanted her to get her HGB checked. 12.6

## 2020-09-18 NOTE — Addendum Note (Signed)
Addended by: Colen Darling on: 09/18/2020 12:13 PM   Modules accepted: Orders

## 2021-03-16 ENCOUNTER — Other Ambulatory Visit (INDEPENDENT_AMBULATORY_CARE_PROVIDER_SITE_OTHER): Payer: BC Managed Care – PPO | Admitting: *Deleted

## 2021-03-16 ENCOUNTER — Other Ambulatory Visit: Payer: Self-pay

## 2021-03-16 VITALS — BP 120/70 | HR 79

## 2021-03-16 DIAGNOSIS — Z3201 Encounter for pregnancy test, result positive: Secondary | ICD-10-CM

## 2021-03-16 LAB — POCT URINE PREGNANCY: Preg Test, Ur: POSITIVE — AB

## 2021-03-16 NOTE — Progress Notes (Signed)
Chart reviewed for nurse visit. Agree with plan of care.  Adline Potter, NP 03/16/2021 2:31 PM

## 2021-03-16 NOTE — Progress Notes (Signed)
   NURSE VISIT- PREGNANCY CONFIRMATION   SUBJECTIVE:  Jill [name] is a 25 y.o. G1P0000 female at [redacted]w[redacted]d by certain LMP of Patient's last menstrual period was 02/03/2021. Here for pregnancy confirmation.  Home pregnancy test: positive x 2  She reports no complaints.  She is not taking prenatal vitamins.    OBJECTIVE:  BP 120/70 (BP Location: Right Arm, Patient Position: Sitting, Cuff Size: Normal)   Pulse 79   LMP 02/03/2021   Appears well, in no apparent distress OB History  Gravida Para Term Preterm AB Living  1 0 0 0 0 0  SAB IAB Ectopic Multiple Live Births  0 0 0 0 0    # Outcome Date GA Lbr Len/2nd Weight Sex Delivery Anes PTL Lv  1 Current             Results for orders placed or performed in visit on 03/16/21 (from the past 24 hour(s))  POCT urine pregnancy   Collection Time: 03/16/21 10:30 AM  Result Value Ref Range   Preg Test, Ur Positive (A) Negative    ASSESSMENT: Positive pregnancy test, [redacted]w[redacted]d by LMP    PLAN: Schedule for dating ultrasound in 3 weeks Prenatal vitamins: plans to begin OTC ASAP   Nausea medicines: not currently needed   OB packet given: Yes  Annamarie Dawley  03/16/2021 10:31 AM

## 2021-04-11 ENCOUNTER — Other Ambulatory Visit: Payer: Self-pay | Admitting: Obstetrics & Gynecology

## 2021-04-11 DIAGNOSIS — O3680X0 Pregnancy with inconclusive fetal viability, not applicable or unspecified: Secondary | ICD-10-CM

## 2021-04-12 ENCOUNTER — Ambulatory Visit (INDEPENDENT_AMBULATORY_CARE_PROVIDER_SITE_OTHER): Payer: BC Managed Care – PPO

## 2021-04-12 ENCOUNTER — Other Ambulatory Visit: Payer: Self-pay

## 2021-04-12 DIAGNOSIS — O3680X0 Pregnancy with inconclusive fetal viability, not applicable or unspecified: Secondary | ICD-10-CM

## 2021-04-12 NOTE — Progress Notes (Signed)
[place] 9+5 wks,single IUP with YS,positive fht 180 bpm,normal ovaries,CRL 27.12 mm

## 2021-05-02 DIAGNOSIS — Z3400 Encounter for supervision of normal first pregnancy, unspecified trimester: Secondary | ICD-10-CM | POA: Insufficient documentation

## 2021-05-04 ENCOUNTER — Inpatient Hospital Stay (HOSPITAL_COMMUNITY)
Admission: AD | Admit: 2021-05-04 | Discharge: 2021-05-04 | Disposition: A | Discharge status: Home / Self Care | Payer: BC Managed Care – PPO | Attending: Obstetrics and Gynecology | Admitting: Obstetrics and Gynecology

## 2021-05-04 ENCOUNTER — Encounter: Payer: Self-pay | Admitting: Emergency Medicine

## 2021-05-04 ENCOUNTER — Other Ambulatory Visit: Payer: Self-pay

## 2021-05-04 ENCOUNTER — Ambulatory Visit: Admission: EM | Admit: 2021-05-04 | Discharge: 2021-05-04 | Payer: BC Managed Care – PPO

## 2021-05-04 DIAGNOSIS — Z3A12 12 weeks gestation of pregnancy: Secondary | ICD-10-CM | POA: Diagnosis not present

## 2021-05-04 DIAGNOSIS — O26891 Other specified pregnancy related conditions, first trimester: Secondary | ICD-10-CM | POA: Diagnosis not present

## 2021-05-04 DIAGNOSIS — R112 Nausea with vomiting, unspecified: Secondary | ICD-10-CM | POA: Diagnosis not present

## 2021-05-04 DIAGNOSIS — K5900 Constipation, unspecified: Secondary | ICD-10-CM | POA: Insufficient documentation

## 2021-05-04 DIAGNOSIS — O99611 Diseases of the digestive system complicating pregnancy, first trimester: Secondary | ICD-10-CM | POA: Insufficient documentation

## 2021-05-04 DIAGNOSIS — R1011 Right upper quadrant pain: Secondary | ICD-10-CM | POA: Insufficient documentation

## 2021-05-04 DIAGNOSIS — O26899 Other specified pregnancy related conditions, unspecified trimester: Secondary | ICD-10-CM

## 2021-05-04 LAB — URINALYSIS, ROUTINE W REFLEX MICROSCOPIC
Bilirubin Urine: NEGATIVE
Glucose, UA: NEGATIVE mg/dL
Hgb urine dipstick: NEGATIVE
Ketones, ur: NEGATIVE mg/dL
Leukocytes,Ua: NEGATIVE
Nitrite: NEGATIVE
Protein, ur: NEGATIVE mg/dL
Specific Gravity, Urine: 1.023 (ref 1.005–1.030)
pH: 6 (ref 5.0–8.0)

## 2021-05-04 LAB — CBC
HCT: 35 % — ABNORMAL LOW (ref 36.0–46.0)
Hemoglobin: 12.1 g/dL (ref 12.0–15.0)
MCH: 30.3 pg (ref 26.0–34.0)
MCHC: 34.6 g/dL (ref 30.0–36.0)
MCV: 87.5 fL (ref 80.0–100.0)
Platelets: 235 10*3/uL (ref 150–400)
RBC: 4 MIL/uL (ref 3.87–5.11)
RDW: 12.5 % (ref 11.5–15.5)
WBC: 8.3 10*3/uL (ref 4.0–10.5)
nRBC: 0 % (ref 0.0–0.2)

## 2021-05-04 LAB — COMPREHENSIVE METABOLIC PANEL
ALT: 31 U/L (ref 0–44)
AST: 23 U/L (ref 15–41)
Albumin: 3.7 g/dL (ref 3.5–5.0)
Alkaline Phosphatase: 48 U/L (ref 38–126)
Anion gap: 9 (ref 5–15)
BUN: 7 mg/dL (ref 6–20)
CO2: 24 mmol/L (ref 22–32)
Calcium: 9.4 mg/dL (ref 8.9–10.3)
Chloride: 103 mmol/L (ref 98–111)
Creatinine, Ser: 0.55 mg/dL (ref 0.44–1.00)
GFR, Estimated: >60 mL/min (ref >60)
Glucose, Bld: 81 mg/dL (ref 70–99)
Potassium: 3.5 mmol/L (ref 3.5–5.1)
Sodium: 136 mmol/L (ref 135–145)
Total Bilirubin: 0.7 mg/dL (ref 0.3–1.2)
Total Protein: 6.7 g/dL (ref 6.5–8.1)

## 2021-05-04 LAB — WET PREP, GENITAL
Clue Cells Wet Prep HPF POC: NONE SEEN
Sperm: NONE SEEN
Trich, Wet Prep: NONE SEEN
Yeast Wet Prep HPF POC: NONE SEEN

## 2021-05-04 MED ORDER — ONDANSETRON 4 MG PO TBDP: 4.0000 mg | ORAL_TABLET | Freq: Three times a day (TID) | ORAL | 1 refills | Status: DC | PRN

## 2021-05-04 MED ORDER — ACETAMINOPHEN ER 650 MG PO TBCR: 650.0000 mg | EXTENDED_RELEASE_TABLET | Freq: Four times a day (QID) | ORAL | Status: DC | PRN

## 2021-05-04 NOTE — ED Triage Notes (Signed)
Lower abd pain states she is [redacted] weeks pregnant, and pain on right side.  Nausea and vomiting last night.  States the side pain goes away when she vomits

## 2021-05-04 NOTE — MAU Provider Note (Signed)
Chief Complaint: Abdominal Pain   None    SUBJECTIVE HPI: Jill [name] is a 25 y.o. G1P0000 at [redacted]w[redacted]d by L/9 who presents to maternity admissions reporting concern of intermittent right upper quadrant pain for several weeks in addition to intermittent lower abdominal pain. On arrival, pt reports symptoms have completely resolved. No medication taken prior to arrival. Pt reports RUQ pain seems to be affected by intake of fatty foods and heavy lifting at work. Areas along abdomen are "tender to touch". Pt reports daily nausea and constipation but rare emesis. No prior abdominal surgeries. She denies vaginal bleeding, vaginal itching/burning, urinary symptoms, h/a, dizziness, n/v, or fever/chills.    No past medical history on file. No past surgical history on file. Social History   Socioeconomic History   Marital status: Single    Spouse name: Not on file   Number of children: Not on file   Years of education: Not on file   Highest education level: Not on file  Occupational History   Not on file  Tobacco Use   Smoking status: Never   Smokeless tobacco: Never  Vaping Use   Vaping Use: Never used  Substance and Sexual Activity   Alcohol use: Yes    Comment: occasional   Drug use: Not Currently   Sexual activity: Not Currently    Birth control/protection: Condom, Implant  Other Topics Concern   Not on file  Social History Narrative   Not on file   Social Determinants of Health   Financial Resource Strain: Not on file  Food Insecurity: Not on file  Transportation Needs: Not on file  Physical Activity: Not on file  Stress: Not on file  Social Connections: Not on file  Intimate Partner Violence: Not on file   No current facility-administered medications on file prior to encounter.   No current outpatient medications on file prior to encounter.   No Known Allergies  ROS:  Review of Systems  Constitutional:  Negative for chills and fever.  HENT:  Negative for congestion  and sore throat.   Eyes:  Negative for photophobia and visual disturbance.  Respiratory:  Negative for cough and shortness of breath.   Cardiovascular:  Negative for chest pain.  Gastrointestinal:  Positive for abdominal pain. Negative for diarrhea, nausea and vomiting.  Genitourinary:  Negative for dysuria, vaginal bleeding, vaginal discharge and vaginal pain.  Musculoskeletal:  Negative for back pain.  Neurological:  Negative for headaches.    I have reviewed patient's Past Medical Hx, Surgical Hx, Family Hx, Social Hx, medications and allergies.   Physical Exam  No data found.  Constitutional: Well-developed, well-nourished female in no acute distress. Sitting up comfortably in chair. Cardiovascular: normal rate Respiratory: normal effort GI: Abd soft, non-tender, non-distended. MS: Extremities nontender, no edema, normal ROM Neurologic: Alert and oriented x 4.  GU: deferred (blind swabs collected).  FHT 170 by doppler.  LAB RESULTS No results found for this or any previous visit (from the past 24 hour(s)).      IMAGING US OB Comp Less 14 Wks  Result Date: 04/13/2021  [facility] [address] [place] [address]  DATING AND VIABILITY SONOGRAM Jill [name] is a 25 y.o. year old G1P0000  Patient's last menstrual period was 02/03/2021. which would correlate to  [redacted]w[redacted]d weeks gestation.  She has regular menstrual cycles.   She is here today for a confirmatory initial sonogram. GESTATION: SINGLETON   FETAL ACTIVITY:          Heart rate         180       PLACENTA LOCALIZATION:  anterior GRADE 0 CERVIX: Appears closed ADNEXA: The ovaries are normal. GESTATIONAL AGE AND  BIOMETRICS: Gestational criteria: Estimated Date of Delivery: 11/10/21 by LMP now at [redacted]w[redacted]d Previous Scans:0    CROWN RUMP LENGTH           27.12 mm         9+4 weeks                                                                       AVERAGE EGA(BY THIS SCAN):  9+4 weeks WORKING EDD( LMP ):  11/10/2021  TECHNICIAN COMMENTS: [place] 9+5 wks,single IUP with YS,positive fht 180 bpm,normal ovaries,CRL 27.12 mm A copy of this report including all images has been saved and backed up to a second source for retrieval if needed. All measures and details of the anatomical scan, placentation, fluid volume and pelvic anatomy are contained in that report. Amber Flora Lipps 04/12/2021 5:08 PM Clinical Impression and recommendations: I have reviewed the sonogram results above, combined with the patient's current clinical course, below are my impressions and any appropriate recommendations for management based on the sonographic findings. Viable early IUP G1P0000 Estimated Date of Delivery: 11/10/21 LMP, today's sonogram Normal general sonographic findings Recommend routine care unless otherwise clinically indicated Lazaro Arms 04/13/2021 11:56 AM   MAU Management/MDM: Orders Placed This Encounter  Procedures   Wet prep, genital   CBC   Comprehensive metabolic panel   Urinalysis, Routine w reflex microscopic   Discharge patient Discharge disposition: 01-Home or Self Care; Discharge patient date: 05/04/2021    Meds ordered this encounter  Medications   ondansetron (ZOFRAN ODT) 4 MG disintegrating tablet    Sig: Take 1 tablet (4 mg total) by mouth every 8 (eight) hours as needed for nausea or vomiting.    Dispense:  20 tablet    Refill:  1   acetaminophen (TYLENOL 8 HOUR) 650 MG CR tablet    Sig: Take 1 tablet (650 mg total) by mouth every 6 (six) hours as needed for pain.     Treatments in MAU included: none. Pt discharged with strict return precautions.  ASSESSMENT & PLAN: Jill [name] is a 25 y.o. G1P0000 at [redacted]w[redacted]d by L/9 who presents to maternity admissions reporting concern of intermittent right upper quadrant pain for several weeks in addition to intermittent lower abdominal pain.  1. Pregnancy with  abdominal pain of right upper quadrant, antepartum   Vital signs stable. Reassuring abdominal exam. CBC, CMP, UA and Wet prep all within normal limits. Discussed likely etiology of round ligament pain and muscle soreness s/p lifting. Also discussed possibility of biliary colic, but no need for RUQ ultrasound given normal labs today and resolution of symptoms without treatment. Constipation and gas pains may also be contributing given  minimal water and increased soda intake. - recommended avoidance of fatty foods and encouraged increased intake of water and fiber; encouraged decreased intake of soda - recommended tylenol as needed for pain; belly band for additional abdominal support; miralax for constipation as needed; zofran as needed for nausea - provided strict return precautions for severe abdominal pain, inability to tolerate fluids, vaginal bleeding, other concerns  Discharge home with plan for follow-up prenatal appointment on 05/11/21 at [facility].  Allergies as of 05/04/2021   No Known Allergies      Medication List     STOP taking these medications    ibuprofen 200 MG tablet Commonly known as: ADVIL       TAKE these medications    acetaminophen 650 MG CR tablet Commonly known as: Tylenol 8 Hour Take 1 tablet (650 mg total) by mouth every 6 (six) hours as needed for pain.   ondansetron 4 MG disintegrating tablet Commonly known as: Zofran ODT Take 1 tablet (4 mg total) by mouth every 8 (eight) hours as needed for nausea or vomiting.       Sheila Oats, MD OB Fellow, Faculty Practice

## 2021-05-04 NOTE — MAU Note (Signed)
Jill [name] is a 25 y.o. at [redacted]w[redacted]d here in MAU reporting: right upper abdominal pain that started a couple of weeks ago and is intermittent. Also having some lower abdominal pain that is intermittent. No bleeding or discharge.   Onset of complaint: ongoing  Pain score: 0/10  Vitals:   05/04/21 1541  BP: 116/64  Pulse: 71  Resp: 16  Temp: 98.5 F (36.9 C)  SpO2: 98%     FHT:170  Lab orders placed from triage: none

## 2021-05-07 DIAGNOSIS — Z029 Encounter for administrative examinations, unspecified: Secondary | ICD-10-CM

## 2021-05-07 LAB — GC/CHLAMYDIA PROBE AMP (~~LOC~~) NOT AT ARMC
Chlamydia: NEGATIVE
Comment: NEGATIVE
Comment: NORMAL
Neisseria Gonorrhea: NEGATIVE

## 2021-05-09 ENCOUNTER — Other Ambulatory Visit: Payer: Self-pay | Admitting: Obstetrics & Gynecology

## 2021-05-09 DIAGNOSIS — Z3682 Encounter for antenatal screening for nuchal translucency: Secondary | ICD-10-CM

## 2021-05-10 ENCOUNTER — Other Ambulatory Visit: Payer: BC Managed Care – PPO

## 2021-05-10 ENCOUNTER — Other Ambulatory Visit: Payer: Self-pay

## 2021-05-10 ENCOUNTER — Ambulatory Visit (INDEPENDENT_AMBULATORY_CARE_PROVIDER_SITE_OTHER): Payer: BC Managed Care – PPO

## 2021-05-10 DIAGNOSIS — Z3682 Encounter for antenatal screening for nuchal translucency: Secondary | ICD-10-CM

## 2021-05-10 DIAGNOSIS — Z3A13 13 weeks gestation of pregnancy: Secondary | ICD-10-CM | POA: Diagnosis not present

## 2021-05-10 DIAGNOSIS — Z3400 Encounter for supervision of normal first pregnancy, unspecified trimester: Secondary | ICD-10-CM

## 2021-05-10 DIAGNOSIS — Z3401 Encounter for supervision of normal first pregnancy, first trimester: Secondary | ICD-10-CM

## 2021-05-10 NOTE — Progress Notes (Signed)
[place] 13+5 wks,measurements c/w dates,fhr 164 bpm,NB present,NT 1.9 mm,anterior placenta,normal ovaries,CRL 76.41 mm

## 2021-05-11 ENCOUNTER — Ambulatory Visit: Payer: BC Managed Care – PPO | Admitting: *Deleted

## 2021-05-11 ENCOUNTER — Encounter: Payer: Self-pay | Admitting: Women's Health

## 2021-05-11 ENCOUNTER — Ambulatory Visit (INDEPENDENT_AMBULATORY_CARE_PROVIDER_SITE_OTHER): Payer: BC Managed Care – PPO | Admitting: Women's Health

## 2021-05-11 VITALS — BP 115/71 | HR 81 | Wt 179.2 lb

## 2021-05-11 DIAGNOSIS — O09899 Supervision of other high risk pregnancies, unspecified trimester: Secondary | ICD-10-CM | POA: Insufficient documentation

## 2021-05-11 DIAGNOSIS — Z3A13 13 weeks gestation of pregnancy: Secondary | ICD-10-CM

## 2021-05-11 DIAGNOSIS — Z2839 Other underimmunization status: Secondary | ICD-10-CM | POA: Insufficient documentation

## 2021-05-11 DIAGNOSIS — Z3401 Encounter for supervision of normal first pregnancy, first trimester: Secondary | ICD-10-CM

## 2021-05-11 DIAGNOSIS — Z363 Encounter for antenatal screening for malformations: Secondary | ICD-10-CM

## 2021-05-11 NOTE — Progress Notes (Signed)
INITIAL OBSTETRICAL VISIT Patient name: Jill [name] MRN [id number]  Date of birth: 1996/11/06 Chief Complaint:   Initial Prenatal Visit  History of Present Illness:   Jill [name] is a 25 y.o. G37P0000 Caucasian female at [redacted]w[redacted]d by LMP c/w u/s at 9 weeks with an Estimated Date of Delivery: 11/10/21 being seen today for her initial obstetrical visit.   Patient's last menstrual period was 02/03/2021. Her obstetrical history is significant for primigravida.   Today she reports N/V-zofran helps Last pap 04/20/20. Results were: NILM w/ HRHPV not done  Depression screen [facility] 2/9 05/11/2021 04/20/2020 04/08/2018 10/07/2017  Decreased Interest 0 0 0 0  Down, Depressed, Hopeless 0 0 0 0  PHQ - 2 Score 0 0 0 0  Altered sleeping 0 1 - -  Tired, decreased energy 1 1 - -  Change in appetite 0 0 - -  Feeling bad or failure about yourself  0 0 - -  Trouble concentrating 0 0 - -  Moving slowly or fidgety/restless 0 0 - -  Suicidal thoughts 0 0 - -  PHQ-9 Score 1 2 - -     GAD 7 : Generalized Anxiety Score 04/20/2020  Nervous, Anxious, on Edge 0  Control/stop worrying 0  Worry too much - different things 0  Trouble relaxing 1  Restless 0  Easily annoyed or irritable 2  Afraid - awful might happen 0  Total GAD 7 Score 3     Review of Systems:   Pertinent items are noted in HPI Denies cramping/contractions, leakage of fluid, vaginal bleeding, abnormal vaginal discharge w/ itching/odor/irritation, headaches, visual changes, shortness of breath, chest pain, abdominal pain, severe nausea/vomiting, or problems with urination or bowel movements unless otherwise stated above.  Pertinent History Reviewed:  Reviewed past medical,surgical, social, obstetrical and family history.  Reviewed problem list, medications and allergies. OB History  Gravida Para Term Preterm AB Living  1 0 0 0 0 0  SAB IAB Ectopic Multiple Live Births  0 0 0 0 0    # Outcome Date GA Lbr Len/2nd Weight Sex Delivery  Anes PTL Lv  1 Current            Physical Assessment:   Vitals:   05/11/21 0952  BP: 115/71  Pulse: 81  Weight: 179 lb 3.2 oz (81.3 kg)  Body mass index is 32.78 kg/m.       Physical Examination:  General appearance - well appearing, and in no distress  Mental status - alert, oriented to person, place, and time  Psych:  She has a normal mood and affect  Skin - warm and dry, normal color, no suspicious lesions noted  Chest - effort normal, all lung fields clear to auscultation bilaterally  Heart - normal rate and regular rhythm  Abdomen - soft, nontender  Extremities:  No swelling or varicosities noted  Thin prep pap is not done   Chaperone: N/A    NT u/s 05/10/21: [place] 13+5 wks,measurements c/w dates,fhr 164 bpm,NB present,NT 1.9 mm,anterior placenta,normal ovaries,CRL 76.41 mm  Results for orders placed or performed in visit on 05/10/21 (from the past 24 hour(s))  CBC/D/Plt+RPR+Rh+ABO+RubIgG...   Collection Time: 05/10/21  2:43 PM  Result Value Ref Range   Hepatitis B Surface Ag Negative Negative   HCV Ab <0.1 0.0 - 0.9 s/co ratio   RPR Ser Ql Non Reactive Non Reactive   Rubella Antibodies, IGG <0.90 (L) Immune >0.99 index   ABO Grouping O  Rh Factor Positive    Antibody Screen Negative Negative   HIV Screen 4th Generation wRfx Non Reactive Non Reactive   WBC 8.5 3.4 - 10.8 x10E3/uL   RBC 3.90 3.77 - 5.28 x10E6/uL   Hemoglobin 11.8 11.1 - 15.9 g/dL   Hematocrit 86.5 78.4 - 46.6 %   MCV 91 79 - 97 fL   MCH 30.3 26.6 - 33.0 pg   MCHC 33.2 31.5 - 35.7 g/dL   RDW 69.6 29.5 - 28.4 %   Platelets 227 150 - 450 x10E3/uL   Neutrophils 73 Not Estab. %   Lymphs 21 Not Estab. %   Monocytes 5 Not Estab. %   Eos 1 Not Estab. %   Basos 0 Not Estab. %   Neutrophils Absolute 6.1 1.4 - 7.0 x10E3/uL   Lymphocytes Absolute 1.8 0.7 - 3.1 x10E3/uL   Monocytes Absolute 0.5 0.1 - 0.9 x10E3/uL   EOS (ABSOLUTE) 0.1 0.0 - 0.4 x10E3/uL   Basophils Absolute 0.0 0.0 - 0.2 x10E3/uL    Immature Granulocytes 0 Not Estab. %   Immature Grans (Abs) 0.0 0.0 - 0.1 x10E3/uL  Integrated 1   Collection Time: 05/10/21  2:43 PM  Result Value Ref Range   Results WILL FOLLOW    Test Results: WILL FOLLOW    Submit Part 2 Sample Using WILL FOLLOW    Crown Rump Length WILL FOLLOW    Crown Rump Length Twin B WILL FOLLOW    CRL Scan WILL FOLLOW    CRL Scan Twin B WILL FOLLOW    Sonographer ID# WILL FOLLOW    Gest. Age on Collection Date WILL FOLLOW    Maternal Age at EDD WILL FOLLOW    Race WILL FOLLOW    Weight WILL FOLLOW    Number of Fetuses WILL FOLLOW    Nuchal Translucency (NT) WILL FOLLOW    NT Twin B WILL FOLLOW    Additional [place] WILL FOLLOW    PAPP-A Value WILL FOLLOW    Comments: WILL FOLLOW    Note: WILL FOLLOW   Interpretation:   Collection Time: 05/10/21  2:43 PM  Result Value Ref Range   HCV Interp 1: Comment     Assessment & Plan:  1) Low-Risk Pregnancy G1P0000 at [redacted]w[redacted]d with an Estimated Date of Delivery: 11/10/21   2) Initial OB visit  3) N/V-has zofran which helps, let us know if needs anything else  Meds: No orders of the defined types were placed in this encounter.   Initial labs obtained yesterday Continue prenatal vitamins Reviewed n/v relief measures and warning s/s to report Reviewed recommended weight gain based on pre-gravid BMI Encouraged well-balanced diet Genetic & carrier screening discussed: requests Panorama, NT/IT, and Horizon ,  Ultrasound discussed; fetal survey: requested CCNC completed> form faxed if has or is planning to apply for medicaid The nature of [place] [facility] [employer] with multiple MDs and other Advanced Practice Providers was explained to patient; also emphasized that fellows, residents, and students are part of our team. Does not have home bp cuff. Office bp cuff given: yes. Rx sent: n/a. Check bp weekly, let us know if consistently >140/90.   Follow-up: Return in 31 days (on 06/11/2021) for LROB, 2nd  IT, XL:[id number], CNM, in person.   Orders Placed This Encounter  Procedures  . Urine Culture  . US OB Comp + 14 Wk  . Pain Management Screening Profile (10S)  . Genetic Screening    Cheral Marker CNM, WHNP-BC  05/11/2021 10:55 AM

## 2021-05-11 NOTE — Patient Instructions (Signed)
Jill [name], thank you for choosing our office today! We appreciate the opportunity to meet your healthcare needs. You may receive a short survey by mail, e-mail, or through [employer]. If you are happy with your care we would appreciate if you could take just a few minutes to complete the survey questions. We read all of your comments and take your feedback very seriously. Thank you again for choosing our office.  Center for [employer] [facility] Team at [facility]  [facility] at [facility] ([address] [place], [place] [address]) Entrance C, located off of [address] Free 24/7 valet parking   Nausea & Vomiting Have saltine crackers or pretzels by your bed and eat a few bites before you raise your head out of bed in the morning Eat small frequent meals throughout the day instead of large meals Drink plenty of fluids throughout the day to stay hydrated, just don't drink a lot of fluids with your meals.  This can make your stomach fill up faster making you feel sick Do not brush your teeth right after you eat Products with real ginger are good for nausea, like ginger ale and ginger hard candy Make sure it says made with real ginger! Sucking on sour candy like lemon heads is also good for nausea If your prenatal vitamins make you nauseated, take them at night so you will sleep through the nausea Sea Bands If you feel like you need medicine for the nausea & vomiting please let us know If you are unable to keep any fluids or food down please let us know   Constipation Drink plenty of fluid, preferably water, throughout the day Eat foods high in fiber such as fruits, vegetables, and grains Exercise, such as walking, is a good way to keep your bowels regular Drink warm fluids, especially warm prune juice, or decaf coffee Eat a 1/2 cup of real oatmeal (not instant), 1/2 cup applesauce, and 1/2-1 cup warm prune juice every day If needed, you may take Colace (docusate sodium) stool softener  once or twice a day to help keep the stool soft.  If you still are having problems with constipation, you may take Miralax once daily as needed to help keep your bowels regular.   Home Blood Pressure Monitoring for Patients   Your provider has recommended that you check your blood pressure (BP) at least once a week at home. If you do not have a blood pressure cuff at home, one will be provided for you. Contact your provider if you have not received your monitor within 1 week.   Helpful Tips for Accurate Home Blood Pressure Checks  Don't smoke, exercise, or drink caffeine 30 minutes before checking your BP Use the restroom before checking your BP (a full bladder can raise your pressure) Relax in a comfortable upright chair Feet on the ground Left arm resting comfortably on a flat surface at the level of your heart Legs uncrossed Back supported Sit quietly and don't talk Place the cuff on your bare arm Adjust snuggly, so that only two fingertips can fit between your skin and the top of the cuff Check 2 readings separated by at least one minute Keep a log of your BP readings For a visual, please reference this diagram: http://ccnc.care/bpdiagram  Provider Name: [facility]     Phone: [phone]  Zone 1: ALL CLEAR  Continue to monitor your symptoms:  BP reading is less than 140 (top number) or less than 90 (bottom  number)  No right upper stomach pain No headaches or seeing spots No feeling nauseated or throwing up No swelling in face and hands  Zone 2: CAUTION Call your doctor's office for any of the following:  BP reading is greater than 140 (top number) or greater than 90 (bottom number)  Stomach pain under your ribs in the middle or right side Headaches or seeing spots Feeling nauseated or throwing up Swelling in face and hands  Zone 3: EMERGENCY  Seek immediate medical care if you have any of the following:  BP reading is greater than160 (top number) or greater than  110 (bottom number) Severe headaches not improving with Tylenol Serious difficulty catching your breath Any worsening symptoms from Zone 2    First Trimester of Pregnancy The first trimester of pregnancy is from week 1 until the end of week 12 (months 1 through 3). A week after a sperm fertilizes an egg, the egg will implant on the wall of the uterus. This embryo will begin to develop into a baby. Genes from you and your partner are forming the baby. The female genes determine whether the baby is a boy or a girl. At 6-8 weeks, the eyes and face are formed, and the heartbeat can be seen on ultrasound. At the end of 12 weeks, all the baby's organs are formed.  Now that you are pregnant, you will want to do everything you can to have a healthy baby. Two of the most important things are to get good prenatal care and to follow your health care provider's instructions. Prenatal care is all the medical care you receive before the baby's birth. This care will help prevent, find, and treat any problems during the pregnancy and childbirth. BODY CHANGES Your body goes through many changes during pregnancy. The changes vary from woman to woman.  You may gain or lose a couple of pounds at first. You may feel sick to your stomach (nauseous) and throw up (vomit). If the vomiting is uncontrollable, call your health care provider. You may tire easily. You may develop headaches that can be relieved by medicines approved by your health care provider. You may urinate more often. Painful urination may mean you have a bladder infection. You may develop heartburn as a result of your pregnancy. You may develop constipation because certain hormones are causing the muscles that push waste through your intestines to slow down. You may develop hemorrhoids or swollen, bulging veins (varicose veins). Your breasts may begin to grow larger and become tender. Your nipples may stick out more, and the tissue that surrounds them  (areola) may become darker. Your gums may bleed and may be sensitive to brushing and flossing. Dark spots or blotches (chloasma, mask of pregnancy) may develop on your face. This will likely fade after the baby is born. Your menstrual periods will stop. You may have a loss of appetite. You may develop cravings for certain kinds of food. You may have changes in your emotions from day to day, such as being excited to be pregnant or being concerned that something may go wrong with the pregnancy and baby. You may have more vivid and strange dreams. You may have changes in your hair. These can include thickening of your hair, rapid growth, and changes in texture. Some women also have hair loss during or after pregnancy, or hair that feels dry or thin. Your hair will most likely return to normal after your baby is born. WHAT TO EXPECT AT YOUR PRENATAL  VISITS During a routine prenatal visit: You will be weighed to make sure you and the baby are growing normally. Your blood pressure will be taken. Your abdomen will be measured to track your baby's growth. The fetal heartbeat will be listened to starting around week 10 or 12 of your pregnancy. Test results from any previous visits will be discussed. Your health care provider may ask you: How you are feeling. If you are feeling the baby move. If you have had any abnormal symptoms, such as leaking fluid, bleeding, severe headaches, or abdominal cramping. If you have any questions. Other tests that may be performed during your first trimester include: Blood tests to find your blood type and to check for the presence of any previous infections. They will also be used to check for low iron levels (anemia) and Rh antibodies. Later in the pregnancy, blood tests for diabetes will be done along with other tests if problems develop. Urine tests to check for infections, diabetes, or protein in the urine. An ultrasound to confirm the proper growth and development  of the baby. An amniocentesis to check for possible genetic problems. Fetal screens for spina bifida and Down syndrome. You may need other tests to make sure you and the baby are doing well. HOME CARE INSTRUCTIONS  Medicines Follow your health care provider's instructions regarding medicine use. Specific medicines may be either safe or unsafe to take during pregnancy. Take your prenatal vitamins as directed. If you develop constipation, try taking a stool softener if your health care provider approves. Diet Eat regular, well-balanced meals. Choose a variety of foods, such as meat or vegetable-based protein, fish, milk and low-fat dairy products, vegetables, fruits, and whole grain breads and cereals. Your health care provider will help you determine the amount of weight gain that is right for you. Avoid raw meat and uncooked cheese. These carry germs that can cause birth defects in the baby. Eating four or five small meals rather than three large meals a day may help relieve nausea and vomiting. If you start to feel nauseous, eating a few soda crackers can be helpful. Drinking liquids between meals instead of during meals also seems to help nausea and vomiting. If you develop constipation, eat more high-fiber foods, such as fresh vegetables or fruit and whole grains. Drink enough fluids to keep your urine clear or pale yellow. Activity and Exercise Exercise only as directed by your health care provider. Exercising will help you: Control your weight. Stay in shape. Be prepared for labor and delivery. Experiencing pain or cramping in the lower abdomen or low back is a good sign that you should stop exercising. Check with your health care provider before continuing normal exercises. Try to avoid standing for long periods of time. Move your legs often if you must stand in one place for a long time. Avoid heavy lifting. Wear low-heeled shoes, and practice good posture. You may continue to have sex  unless your health care provider directs you otherwise. Relief of Pain or Discomfort Wear a good support bra for breast tenderness.   Take warm sitz baths to soothe any pain or discomfort caused by hemorrhoids. Use hemorrhoid cream if your health care provider approves.   Rest with your legs elevated if you have leg cramps or low back pain. If you develop varicose veins in your legs, wear support hose. Elevate your feet for 15 minutes, 3-4 times a day. Limit salt in your diet. Prenatal Care Schedule your prenatal visits by the  twelfth week of pregnancy. They are usually scheduled monthly at first, then more often in the last 2 months before delivery. Write down your questions. Take them to your prenatal visits. Keep all your prenatal visits as directed by your health care provider. Safety Wear your seat belt at all times when driving. Make a list of emergency phone numbers, including numbers for family, friends, the hospital, and police and fire departments. General Tips Ask your health care provider for a referral to a local prenatal education class. Begin classes no later than at the beginning of month 6 of your pregnancy. Ask for help if you have counseling or nutritional needs during pregnancy. Your health care provider can offer advice or refer you to specialists for help with various needs. Do not use hot tubs, steam rooms, or saunas. Do not douche or use tampons or scented sanitary pads. Do not cross your legs for long periods of time. Avoid cat litter boxes and soil used by cats. These carry germs that can cause birth defects in the baby and possibly loss of the fetus by miscarriage or stillbirth. Avoid all smoking, herbs, alcohol, and medicines not prescribed by your health care provider. Chemicals in these affect the formation and growth of the baby. Schedule a dentist appointment. At home, brush your teeth with a soft toothbrush and be gentle when you floss. SEEK MEDICAL CARE IF:   You have dizziness. You have mild pelvic cramps, pelvic pressure, or nagging pain in the abdominal area. You have persistent nausea, vomiting, or diarrhea. You have a bad smelling vaginal discharge. You have pain with urination. You notice increased swelling in your face, hands, legs, or ankles. SEEK IMMEDIATE MEDICAL CARE IF:  You have a fever. You are leaking fluid from your vagina. You have spotting or bleeding from your vagina. You have severe abdominal cramping or pain. You have rapid weight gain or loss. You vomit blood or material that looks like coffee grounds. You are exposed to [place] measles and have never had them. You are exposed to fifth disease or chickenpox. You develop a severe headache. You have shortness of breath. You have any kind of trauma, such as from a fall or a car accident. Document Released: 10/29/2001 Document Revised: 03/21/2014 Document Reviewed: 09/14/2013 [facility] Patient Information 2015 [place], [place]. This information is not intended to replace advice given to you by your health care provider. Make sure you discuss any questions you have with your health care provider.

## 2021-05-12 LAB — CBC/D/PLT+RPR+RH+ABO+RUBIGG...
Antibody Screen: NEGATIVE
Basophils Absolute: 0 10*3/uL (ref 0.0–0.2)
Basos: 0 %
EOS (ABSOLUTE): 0.1 10*3/uL (ref 0.0–0.4)
Eos: 1 %
HCV Ab: <0.1 s/co ratio (ref 0.0–0.9)
HIV Screen 4th Generation wRfx: NONREACTIVE
Hematocrit: 35.5 % (ref 34.0–46.6)
Hemoglobin: 11.8 g/dL (ref 11.1–15.9)
Hepatitis B Surface Ag: NEGATIVE
Immature Grans (Abs): 0 10*3/uL (ref 0.0–0.1)
Immature Granulocytes: 0 %
Lymphocytes Absolute: 1.8 10*3/uL (ref 0.7–3.1)
Lymphs: 21 %
MCH: 30.3 pg (ref 26.6–33.0)
MCHC: 33.2 g/dL (ref 31.5–35.7)
MCV: 91 fL (ref 79–97)
Monocytes Absolute: 0.5 10*3/uL (ref 0.1–0.9)
Monocytes: 5 %
Neutrophils Absolute: 6.1 10*3/uL (ref 1.4–7.0)
Neutrophils: 73 %
Platelets: 227 10*3/uL (ref 150–450)
RBC: 3.9 x10E6/uL (ref 3.77–5.28)
RDW: 13.3 % (ref 11.7–15.4)
RPR Ser Ql: NONREACTIVE
Rh Factor: POSITIVE
Rubella Antibodies, IGG: <0.9 index — ABNORMAL LOW (ref >0.99)
WBC: 8.5 10*3/uL (ref 3.4–10.8)

## 2021-05-12 LAB — PMP SCREEN PROFILE (10S), URINE
Amphetamine Scrn, Ur: NEGATIVE ng/mL
BARBITURATE SCREEN URINE: NEGATIVE ng/mL
BENZODIAZEPINE SCREEN, URINE: NEGATIVE ng/mL
CANNABINOIDS UR QL SCN: NEGATIVE ng/mL
Cocaine (Metab) Scrn, Ur: NEGATIVE ng/mL
Creatinine(Crt), U: 211.9 mg/dL (ref 20.0–300.0)
Methadone Screen, Urine: NEGATIVE ng/mL
OXYCODONE+OXYMORPHONE UR QL SCN: NEGATIVE ng/mL
Opiate Scrn, Ur: NEGATIVE ng/mL
Ph of Urine: 6.6 (ref 4.5–8.9)
Phencyclidine Qn, Ur: NEGATIVE ng/mL
Propoxyphene Scrn, Ur: NEGATIVE ng/mL

## 2021-05-12 LAB — INTEGRATED 1
Crown Rump Length: 76.4 mm
Gest. Age on Collection Date: 13.4 weeks
Maternal Age at EDD: 23.1 yr
Nuchal Translucency (NT): 1.9 mm
Number of Fetuses: 1
PAPP-A Value: 1023 ng/mL
Weight: 180 [lb_av]

## 2021-05-12 LAB — MED LIST OPTION NOT SELECTED

## 2021-05-12 LAB — HCV INTERPRETATION

## 2021-05-13 LAB — URINE CULTURE: Organism ID, Bacteria: NO GROWTH

## 2021-05-14 ENCOUNTER — Encounter: Payer: Self-pay | Admitting: *Deleted

## 2021-05-16 ENCOUNTER — Telehealth: Payer: Self-pay

## 2021-05-22 ENCOUNTER — Other Ambulatory Visit: Payer: Self-pay | Admitting: Women's Health

## 2021-05-22 MED ORDER — DOXYLAMINE-PYRIDOXINE 10-10 MG PO TBEC: DELAYED_RELEASE_TABLET | ORAL | 6 refills | Status: DC

## 2021-06-18 ENCOUNTER — Ambulatory Visit (INDEPENDENT_AMBULATORY_CARE_PROVIDER_SITE_OTHER): Payer: BC Managed Care – PPO | Admitting: Women's Health

## 2021-06-18 ENCOUNTER — Encounter: Payer: Self-pay | Admitting: Women's Health

## 2021-06-18 ENCOUNTER — Other Ambulatory Visit: Payer: Self-pay

## 2021-06-18 ENCOUNTER — Ambulatory Visit (INDEPENDENT_AMBULATORY_CARE_PROVIDER_SITE_OTHER): Payer: BC Managed Care – PPO

## 2021-06-18 VITALS — BP 107/69 | HR 85 | Wt 182.0 lb

## 2021-06-18 DIAGNOSIS — Z3401 Encounter for supervision of normal first pregnancy, first trimester: Secondary | ICD-10-CM

## 2021-06-18 DIAGNOSIS — Z3402 Encounter for supervision of normal first pregnancy, second trimester: Secondary | ICD-10-CM

## 2021-06-18 DIAGNOSIS — Z3A19 19 weeks gestation of pregnancy: Secondary | ICD-10-CM | POA: Diagnosis not present

## 2021-06-18 DIAGNOSIS — Z363 Encounter for antenatal screening for malformations: Secondary | ICD-10-CM

## 2021-06-18 NOTE — Progress Notes (Signed)
LOW-RISK PREGNANCY VISIT Patient name: Jill Jill [name] MRN [id number]  Date of birth: [birth year] Chief Complaint:   Routine Prenatal Visit (Anatomy scan)  History of Present Illness:   Jill Jill [name] is a 25 y.o. G83P0000 female at [redacted]w[redacted]d with an Estimated Date of Delivery: 11/10/21 being seen today for ongoing management of a low-risk pregnancy.   Today she reports Jill [name] complaints. Contractions: Not present.  .  Movement: Present. denies leaking of fluid.  Depression screen [facility] 2/9 05/11/2021 04/20/2020 04/08/2018 10/07/2017  Decreased Interest 0 0 0 0  Down, Depressed, Hopeless 0 0 0 0  PHQ - 2 Score 0 0 0 0  Altered sleeping 0 1 - -  Tired, decreased energy 1 1 - -  Change in appetite 0 0 - -  Feeling bad or failure about yourself  0 0 - -  Trouble concentrating 0 0 - -  Moving slowly or fidgety/restless 0 0 - -  Suicidal thoughts 0 0 - -  PHQ-9 Score 1 2 - -     GAD 7 : Generalized Anxiety Score 04/20/2020  Nervous, Anxious, on Edge 0  Control/stop worrying 0  Worry too much - different things 0  Trouble relaxing 1  Restless 0  Easily annoyed or irritable 2  Afraid - awful might happen 0  Total GAD 7 Score 3      Review of Systems:   Pertinent items are noted in HPI Denies abnormal vaginal discharge w/ itching/odor/irritation, headaches, visual changes, shortness of breath, chest pain, abdominal pain, severe nausea/vomiting, or problems with urination or bowel movements unless otherwise stated above. Pertinent History Reviewed:  Reviewed past medical,surgical, social, obstetrical and family history.  Reviewed problem list, medications and allergies. Physical Assessment:   Vitals:   06/18/21 0911  BP: 107/69  Pulse: 85  Weight: 182 lb (82.6 kg)  Body mass index is 33.29 kg/m.        Physical Examination:   General appearance: Well appearing, and in Jill [name] distress  Mental status: Alert, oriented to person, place, and time  Skin: Warm & dry  Cardiovascular: Normal  heart rate noted  Respiratory: Normal respiratory effort, Jill [name] distress  Abdomen: Soft, gravid, nontender  Pelvic: Cervical exam deferred         Extremities: Edema: None  Fetal Status: Fetal Heart Rate (bpm): 144 u/s   Movement: Present  [place] 19+2 wks,breech,cx 2.9 cm,normal ovaries,anterior placenta gr 0,svp of fluid 3.8 cm,fhr 144 bpm,bilateral choroid plexus cysts,right CPC 13.7 X 7.1 X 10.8 mm,left CPC 9.3 X 5.2 X 8 mm,EFW 297 g 58%,anatomy complete  Chaperone: N/A   Jill [name] results found for this or any previous visit (from the past 24 hour(s)).  Assessment & Plan:  1) Low-risk pregnancy G1P0000 at [redacted]w[redacted]d with an Estimated Date of Delivery: 11/10/21   2) Fetal bilateral CPC, low-risk NIPS, discussed and gave printed info   Meds: Jill [name] orders of the defined types were placed in this encounter.  Labs/procedures today: U/S and 2nd IT  Plan:  Continue routine obstetrical care  Next visit: prefers in person    Reviewed: Preterm labor symptoms and general obstetric precautions including but not limited to vaginal bleeding, contractions, leaking of fluid and fetal movement were reviewed in detail with the patient.  All questions were answered. Does have home bp cuff. Office bp cuff given: not applicable. Check bp weekly, let us know if consistently >140 and/or >90.  Follow-up: Return in about 4 weeks (around 07/16/2021) for LROB, CNM, in  person.  Future Appointments  Date Time Provider Department Center  07/20/2021  8:30 AM Cheral Marker, CNM [facility]    Orders Placed This Encounter  Procedures   INTEGRATED 2   Cheral Marker CNM, [facility] 06/18/2021 9:50 AM

## 2021-06-18 NOTE — Progress Notes (Signed)
[place] 19+2 wks,breech,cx 2.9 cm,normal ovaries,anterior placenta gr 0,svp of fluid 3.8 cm,fhr 144 bpm,bilateral choroid plexus cysts,right CPC 13.7 X 7.1 X 10.8 mm,left CPC 9.3 X 5.2 X 8 mm,EFW 297 g 58%,anatomy complete

## 2021-06-18 NOTE — Patient Instructions (Signed)
Jill [name], thank you for choosing our office today! We appreciate the opportunity to meet your healthcare needs. You may receive a short survey by mail, e-mail, or through [employer]. If you are happy with your care we would appreciate if you could take just a few minutes to complete the survey questions. We read all of your comments and take your feedback very seriously. Thank you again for choosing our office.  Center for [employer] Team at [facility] at [facility] ([address] [place], [place] [address]) Entrance C, located off of [address] Free 24/7 valet parking  Go to [email or web address] to register for FREE online childbirth classes  Call the office [phone]) or go to [facility] if: You begin to severe cramping Your water breaks.  Sometimes it is a big gush of fluid, sometimes it is just a trickle that keeps getting your panties wet or running down your legs You have vaginal bleeding.  It is normal to have a small amount of spotting if your cervix was checked.   [facility] Pediatricians/Family Doctors [facility]): [address] Colette Ribas, [phone]           [facility]: [address], [phone]                [facility]): [address], [phone] (call to ask if accepting patients) [facility] Department: [address], [place], [phone]    [facility] Pediatricians/Family Doctors [facility]): [phone] [address], [phone] [facility]: [address] [place], [phone] [facility] [place]: [address], [phone]  [facility] Doctors  [facility]): [phone] [facility]: [address], [phone]   [facility] Doctors [facility]: [address], [phone]  [facility] Doctors  [facility]: [phone], [phone]  Home Blood Pressure Monitoring for Patients   Your provider has recommended that you check your blood pressure (BP) at least once a week at home. If you do not have a blood pressure cuff at home, one will be provided for you. Contact your provider if you have not received your monitor within 1 week.   Helpful Tips for Accurate Home Blood Pressure Checks  Don't smoke, exercise, or drink caffeine 30 minutes before checking your BP Use the restroom before checking your BP (a full bladder can raise your pressure) Relax in a comfortable upright chair Feet on the ground Left arm resting comfortably on a flat surface at the level of your heart Legs uncrossed Back supported Sit quietly and don't talk Place the cuff on your bare arm Adjust snuggly, so that only two fingertips can fit between your skin and the top of the cuff Check 2 readings separated by at least one minute Keep a log of your BP readings For a visual, please reference this diagram: http://ccnc.care/bpdiagram  Provider Name: [facility]     Phone: [phone]  Zone 1: ALL CLEAR  Continue to monitor your symptoms:  BP reading is less than 140 (top number) or less than 90 (bottom number)  No right upper stomach pain No headaches or seeing spots No feeling nauseated or throwing up No swelling in face and hands  Zone 2: CAUTION Call your doctor's office for any of the following:  BP reading is greater than 140 (top number) or greater than  90 (bottom number)  Stomach pain under your ribs in the middle or right side Headaches or seeing spots Feeling nauseated or throwing up Swelling in face and hands  Zone 3: EMERGENCY  Seek immediate medical care if you have any of the following:  BP reading is greater than160 (top number) or greater than 110 (bottom number) Severe headaches not improving with Tylenol Serious difficulty catching your breath Any worsening symptoms from  Zone 2     Second Trimester of Pregnancy The second trimester is from week 14 through week 27 (months 4 through 6). The second trimester is often a time when you feel your best. Your body has adjusted to being pregnant, and you begin to feel better physically. Usually, morning sickness has lessened or quit completely, you may have more energy, and you may have an increase in appetite. The second trimester is also a time when the fetus is growing rapidly. At the end of the sixth month, the fetus is about 9 inches long and weighs about 1 pounds. You will likely begin to feel the baby move (quickening) between 16 and 20 weeks of pregnancy. Body changes during your second trimester Your body continues to go through many changes during your second trimester. The changes vary from woman to woman. Your weight will continue to increase. You will notice your lower abdomen bulging out. You may begin to get stretch marks on your hips, abdomen, and breasts. You may develop headaches that can be relieved by medicines. The medicines should be approved by your health care provider. You may urinate more often because the fetus is pressing on your bladder. You may develop or continue to have heartburn as a result of your pregnancy. You may develop constipation because certain hormones are causing the muscles that push waste through your intestines to slow down. You may develop hemorrhoids or swollen, bulging veins (varicose veins). You may have back pain. This is caused by: Weight gain. Pregnancy hormones that are relaxing the joints in your pelvis. A shift in weight and the muscles that support your balance. Your breasts will continue to grow and they will continue to become tender. Your gums may bleed and may be sensitive to brushing and flossing. Dark spots or blotches (chloasma, mask of pregnancy) may develop on your face. This will likely fade after the baby is born. A dark line from your belly button to  the pubic area (linea nigra) may appear. This will likely fade after the baby is born. You may have changes in your hair. These can include thickening of your hair, rapid growth, and changes in texture. Some women also have hair loss during or after pregnancy, or hair that feels dry or thin. Your hair will most likely return to normal after your baby is born.  What to expect at prenatal visits During a routine prenatal visit: You will be weighed to make sure you and the fetus are growing normally. Your blood pressure will be taken. Your abdomen will be measured to track your baby's growth. The fetal heartbeat will be listened to. Any test results from the previous visit will be discussed.  Your health care provider may ask you: How you are feeling. If you are feeling the baby move. If you have had any abnormal symptoms, such as leaking fluid, bleeding, severe headaches, or abdominal cramping. If you are using any tobacco products, including cigarettes, chewing tobacco, and electronic cigarettes. If you have any questions.  Other tests that may be performed during   your second trimester include: Blood tests that check for: Low iron levels (anemia). High blood sugar that affects pregnant women (gestational diabetes) between 24 and 28 weeks. Rh antibodies. This is to check for a protein on red blood cells (Rh factor). Urine tests to check for infections, diabetes, or protein in the urine. An ultrasound to confirm the proper growth and development of the baby. An amniocentesis to check for possible genetic problems. Fetal screens for spina bifida and Down syndrome. HIV (human immunodeficiency virus) testing. Routine prenatal testing includes screening for HIV, unless you choose not to have this test.  Follow these instructions at home: Medicines Follow your health care provider's instructions regarding medicine use. Specific medicines may be either safe or unsafe to take during  pregnancy. Take a prenatal vitamin that contains at least 600 micrograms (mcg) of folic acid. If you develop constipation, try taking a stool softener if your health care provider approves. Eating and drinking Eat a balanced diet that includes fresh fruits and vegetables, whole grains, good sources of protein such as meat, eggs, or tofu, and low-fat dairy. Your health care provider will help you determine the amount of weight gain that is right for you. Avoid raw meat and uncooked cheese. These carry germs that can cause birth defects in the baby. If you have low calcium intake from food, talk to your health care provider about whether you should take a daily calcium supplement. Limit foods that are high in fat and processed sugars, such as fried and sweet foods. To prevent constipation: Drink enough fluid to keep your urine clear or pale yellow. Eat foods that are high in fiber, such as fresh fruits and vegetables, whole grains, and beans. Activity Exercise only as directed by your health care provider. Most women can continue their usual exercise routine during pregnancy. Try to exercise for 30 minutes at least 5 days a week. Stop exercising if you experience uterine contractions. Avoid heavy lifting, wear low heel shoes, and practice good posture. A sexual relationship may be continued unless your health care provider directs you otherwise. Relieving pain and discomfort Wear a good support bra to prevent discomfort from breast tenderness. Take warm sitz baths to soothe any pain or discomfort caused by hemorrhoids. Use hemorrhoid cream if your health care provider approves. Rest with your legs elevated if you have leg cramps or low back pain. If you develop varicose veins, wear support hose. Elevate your feet for 15 minutes, 3-4 times a day. Limit salt in your diet. Prenatal Care Write down your questions. Take them to your prenatal visits. Keep all your prenatal visits as told by your health  care provider. This is important. Safety Wear your seat belt at all times when driving. Make a list of emergency phone numbers, including numbers for family, friends, the hospital, and police and fire departments. General instructions Ask your health care provider for a referral to a local prenatal education class. Begin classes no later than the beginning of month 6 of your pregnancy. Ask for help if you have counseling or nutritional needs during pregnancy. Your health care provider can offer advice or refer you to specialists for help with various needs. Do not use hot tubs, steam rooms, or saunas. Do not douche or use tampons or scented sanitary pads. Do not cross your legs for long periods of time. Avoid cat litter boxes and soil used by cats. These carry germs that can cause birth defects in the baby and possibly loss of the   fetus by miscarriage or stillbirth. Avoid all smoking, herbs, alcohol, and unprescribed drugs. Chemicals in these products can affect the formation and growth of the baby. Do not use any products that contain nicotine or tobacco, such as cigarettes and e-cigarettes. If you need help quitting, ask your health care provider. Visit your dentist if you have not gone yet during your pregnancy. Use a soft toothbrush to brush your teeth and be gentle when you floss. Contact a health care provider if: You have dizziness. You have mild pelvic cramps, pelvic pressure, or nagging pain in the abdominal area. You have persistent nausea, vomiting, or diarrhea. You have a bad smelling vaginal discharge. You have pain when you urinate. Get help right away if: You have a fever. You are leaking fluid from your vagina. You have spotting or bleeding from your vagina. You have severe abdominal cramping or pain. You have rapid weight gain or weight loss. You have shortness of breath with chest pain. You notice sudden or extreme swelling of your face, hands, ankles, feet, or legs. You  have not felt your baby move in over an hour. You have severe headaches that do not go away when you take medicine. You have vision changes. Summary The second trimester is from week 14 through week 27 (months 4 through 6). It is also a time when the fetus is growing rapidly. Your body goes through many changes during pregnancy. The changes vary from woman to woman. Avoid all smoking, herbs, alcohol, and unprescribed drugs. These chemicals affect the formation and growth your baby. Do not use any tobacco products, such as cigarettes, chewing tobacco, and e-cigarettes. If you need help quitting, ask your health care provider. Contact your health care provider if you have any questions. Keep all prenatal visits as told by your health care provider. This is important. This information is not intended to replace advice given to you by your health care provider. Make sure you discuss any questions you have with your health care provider. Document Released: 10/29/2001 Document Revised: 04/11/2016 Document Reviewed: 01/05/2013 Elsevier Interactive Patient Education  2017 Elsevier Inc.  

## 2021-06-20 LAB — INTEGRATED 2
AFP MoM: 0.41
Alpha-Fetoprotein: 17.8 ng/mL
Crown Rump Length: 76.4 mm
DIA MoM: 1.5
DIA Value: 217.7 pg/mL
Estriol, Unconjugated: 2.28 ng/mL
Gest. Age on Collection Date: 13.4 weeks
Gestational Age: 19 weeks
Maternal Age at EDD: 23.1 yr
Nuchal Translucency (NT): 1.9 mm
Nuchal Translucency MoM: 1.07
Number of Fetuses: 1
PAPP-A MoM: 0.91
PAPP-A Value: 1023 ng/mL
Test Results:: NEGATIVE
Weight: 180 [lb_av]
Weight: 180 [lb_av]
hCG MoM: 1.22
hCG Value: 26.6 IU/mL
uE3 MoM: 1.28

## 2021-07-20 ENCOUNTER — Other Ambulatory Visit: Payer: Self-pay

## 2021-07-20 ENCOUNTER — Ambulatory Visit (INDEPENDENT_AMBULATORY_CARE_PROVIDER_SITE_OTHER): Payer: BC Managed Care – PPO | Admitting: Women's Health

## 2021-07-20 ENCOUNTER — Encounter: Payer: Self-pay | Admitting: Women's Health

## 2021-07-20 VITALS — BP 127/70 | HR 85 | Wt 188.0 lb

## 2021-07-20 DIAGNOSIS — Z3A23 23 weeks gestation of pregnancy: Secondary | ICD-10-CM

## 2021-07-20 DIAGNOSIS — Z23 Encounter for immunization: Secondary | ICD-10-CM

## 2021-07-20 DIAGNOSIS — Z3402 Encounter for supervision of normal first pregnancy, second trimester: Secondary | ICD-10-CM

## 2021-07-20 NOTE — Progress Notes (Signed)
LOW-RISK PREGNANCY VISIT Patient name: Jill [name] MRN [id number]  Date of birth: 29-Jul-1996 Chief Complaint:   Routine Prenatal Visit  History of Present Illness:   Jill [name] is a 25 y.o. G80P0000 female at [redacted]w[redacted]d with an Estimated Date of Delivery: 11/10/21 being seen today for ongoing management of a low-risk pregnancy.   Today she reports  gets dizzy at work b/c not able to snack, also pushing 300lb+, requests work note-given . Contractions: Not present. Vag. Bleeding: None.  Movement: Present. denies leaking of fluid.  Depression screen [facility] 2/9 05/11/2021 04/20/2020 04/08/2018 10/07/2017  Decreased Interest 0 0 0 0  Down, Depressed, Hopeless 0 0 0 0  PHQ - 2 Score 0 0 0 0  Altered sleeping 0 1 - -  Tired, decreased energy 1 1 - -  Change in appetite 0 0 - -  Feeling bad or failure about yourself  0 0 - -  Trouble concentrating 0 0 - -  Moving slowly or fidgety/restless 0 0 - -  Suicidal thoughts 0 0 - -  PHQ-9 Score 1 2 - -     GAD 7 : Generalized Anxiety Score 04/20/2020  Nervous, Anxious, on Edge 0  Control/stop worrying 0  Worry too much - different things 0  Trouble relaxing 1  Restless 0  Easily annoyed or irritable 2  Afraid - awful might happen 0  Total GAD 7 Score 3      Review of Systems:   Pertinent items are noted in HPI Denies abnormal vaginal discharge w/ itching/odor/irritation, headaches, visual changes, shortness of breath, chest pain, abdominal pain, severe nausea/vomiting, or problems with urination or bowel movements unless otherwise stated above. Pertinent History Reviewed:  Reviewed past medical,surgical, social, obstetrical and family history.  Reviewed problem list, medications and allergies. Physical Assessment:   Vitals:   07/20/21 0830  BP: 127/70  Pulse: 85  Weight: 188 lb (85.3 kg)  Body mass index is 34.39 kg/m.        Physical Examination:   General appearance: Well appearing, and in no distress  Mental status: Alert,  oriented to person, place, and time  Skin: Warm & dry  Cardiovascular: Normal heart rate noted  Respiratory: Normal respiratory effort, no distress  Abdomen: Soft, gravid, nontender  Pelvic: Cervical exam deferred         Extremities: Edema: None  Fetal Status: Fetal Heart Rate (bpm): 150 Fundal Height: 23 cm Movement: Present    Chaperone: N/A   No results found for this or any previous visit (from the past 24 hour(s)).  Assessment & Plan:  1) Low-risk pregnancy G1P0000 at [redacted]w[redacted]d with an Estimated Date of Delivery: 11/10/21    Meds: No orders of the defined types were placed in this encounter.  Labs/procedures today: flu shot  Plan:  Continue routine obstetrical care  Next visit: prefers will be in person for pn2     Reviewed: Preterm labor symptoms and general obstetric precautions including but not limited to vaginal bleeding, contractions, leaking of fluid and fetal movement were reviewed in detail with the patient.  All questions were answered. Does have home bp cuff. Office bp cuff given: not applicable. Check bp weekly, let us know if consistently >140 and/or >90.  Follow-up: Return in about 4 weeks (around 08/17/2021) for LROB, PN2, CNM, in person.  Future Appointments  Date Time Provider Department Center  08/21/2021  8:50 AM [facility]  08/21/2021  9:10 AM Cheral Marker, CNM  [facility]    Orders Placed This Encounter  Procedures   Flu Vaccine QUAD 18mo+IM (Fluarix, Fluzone & Alfiuria Quad PF)    Cheral Marker CNM, [facility] 07/20/2021 8:59 AM

## 2021-07-20 NOTE — Patient Instructions (Signed)
Jill [name], thank you for choosing our office today! We appreciate the opportunity to meet your healthcare needs. You may receive a short survey by mail, e-mail, or through [employer]. If you are happy with your care we would appreciate if you could take just a few minutes to complete the survey questions. We read all of your comments and take your feedback very seriously. Thank you again for choosing our office.  Center for [employer] Team at [facility]  [facility] at [facility] ([address] [place], [place] [address]) Entrance C, located off of [address] Free 24/7 valet parking   You will have your sugar test next visit.  Please do not eat or drink anything after midnight the night before you come, not even water.  You will be here for at least two hours.  Please make an appointment online for the bloodwork at [email or web address] for 8:00am (or as close to this as possible). Make sure you select the [facility] service center.   CLASSES: Go to [email or web address] to register for classes (childbirth, breastfeeding, waterbirth, infant CPR, daddy bootcamp, etc.)  Call the office [phone]) or go to [facility] if: You begin to have strong, frequent contractions Your water breaks.  Sometimes it is a big gush of fluid, sometimes it is just a trickle that keeps getting your panties wet or running down your legs You have vaginal bleeding.  It is normal to have a small amount of spotting if your cervix was checked.  You don't feel your baby moving like normal.  If you don't, get you something to eat and drink and lay down and focus on feeling your baby move.   If your baby is still not moving like normal, you should call the office or go to [facility].  Call the office [phone]) or go to [facility] hospital for these signs of pre-eclampsia: Severe headache that does not go away with Tylenol Visual changes- seeing spots, double, blurred vision Pain under your right breast or upper  abdomen that does not go away with Tums or heartburn medicine Nausea and/or vomiting Severe swelling in your hands, feet, and face    [facility] Pediatricians/Family Doctors [facility]): [address] Colette Ribas, [phone]           [facility]: [address], [phone]                [facility]): [address], [phone] (call to ask if accepting patients) [facility] Department: [address] [place], [phone]    [facility] Pediatricians/Family Doctors [facility]): [address], [phone] [facility]: [address] [place], [phone] [facility]: [address], [phone]  [facility] Doctors  [facility]): [phone] [facility]: [address], [phone]   [facility] Doctors [facility]: [address], [phone]  [facility] Doctors  [facility]: [phone], [phone]  Home Blood Pressure Monitoring for Patients   Your provider has recommended that you check your blood pressure (BP) at least once a week at home. If you do not have a blood pressure cuff at home, one will be provided for you. Contact your provider if you have not received your monitor within 1 week.   Helpful Tips for Accurate Home Blood Pressure Checks  Don't smoke, exercise, or drink  caffeine 30 minutes before checking your BP Use the restroom before checking your BP (a full bladder can raise your pressure) Relax in a comfortable upright chair Feet on the ground Left arm resting comfortably on a flat surface at the level of your heart Legs uncrossed Back supported Sit quietly and don't talk Place the cuff on your bare arm Adjust snuggly, so that only two fingertips can fit between your skin and the top of the cuff Check 2  readings separated by at least one minute Keep a log of your BP readings For a visual, please reference this diagram: http://ccnc.care/bpdiagram  Provider Name: [facility]     Phone: [phone]  Zone 1: ALL CLEAR  Continue to monitor your symptoms:  BP reading is less than 140 (top number) or less than 90 (bottom number)  No right upper stomach pain No headaches or seeing spots No feeling nauseated or throwing up No swelling in face and hands  Zone 2: CAUTION Call your doctor's office for any of the following:  BP reading is greater than 140 (top number) or greater than 90 (bottom number)  Stomach pain under your ribs in the middle or right side Headaches or seeing spots Feeling nauseated or throwing up Swelling in face and hands  Zone 3: EMERGENCY  Seek immediate medical care if you have any of the following:  BP reading is greater than160 (top number) or greater than 110 (bottom number) Severe headaches not improving with Tylenol Serious difficulty catching your breath Any worsening symptoms from Zone 2   Second Trimester of Pregnancy The second trimester is from week 13 through week 28, months 4 through 6. The second trimester is often a time when you feel your best. Your body has also adjusted to being pregnant, and you begin to feel better physically. Usually, morning sickness has lessened or quit completely, you may have more energy, and you may have an increase in appetite. The second trimester is also a time when the fetus is growing rapidly. At the end of the sixth month, the fetus is about 9 inches long and weighs about 1 pounds. You will likely begin to feel the baby move (quickening) between 18 and 20 weeks of the pregnancy. BODY CHANGES Your body goes through many changes during pregnancy. The changes vary from woman to woman.  Your weight will continue to increase. You will notice your lower abdomen bulging out. You may begin to get stretch marks on your  hips, abdomen, and breasts. You may develop headaches that can be relieved by medicines approved by your health care provider. You may urinate more often because the fetus is pressing on your bladder. You may develop or continue to have heartburn as a result of your pregnancy. You may develop constipation because certain hormones are causing the muscles that push waste through your intestines to slow down. You may develop hemorrhoids or swollen, bulging veins (varicose veins). You may have back pain because of the weight gain and pregnancy hormones relaxing your joints between the bones in your pelvis and as a result of a shift in weight and the muscles that support your balance. Your breasts will continue to grow and be tender. Your gums may bleed and may be sensitive to brushing and flossing. Dark spots or blotches (chloasma, mask of pregnancy) may develop on your face. This will likely fade after the baby is born. A dark line from your belly button to the pubic area (linea nigra) may appear. This  will likely fade after the baby is born. You may have changes in your hair. These can include thickening of your hair, rapid growth, and changes in texture. Some women also have hair loss during or after pregnancy, or hair that feels dry or thin. Your hair will most likely return to normal after your baby is born. WHAT TO EXPECT AT YOUR PRENATAL VISITS During a routine prenatal visit: You will be weighed to make sure you and the fetus are growing normally. Your blood pressure will be taken. Your abdomen will be measured to track your baby's growth. The fetal heartbeat will be listened to. Any test results from the previous visit will be discussed. Your health care provider may ask you: How you are feeling. If you are feeling the baby move. If you have had any abnormal symptoms, such as leaking fluid, bleeding, severe headaches, or abdominal cramping. If you have any questions. Other tests that may  be performed during your second trimester include: Blood tests that check for: Low iron levels (anemia). Gestational diabetes (between 24 and 28 weeks). Rh antibodies. Urine tests to check for infections, diabetes, or protein in the urine. An ultrasound to confirm the proper growth and development of the baby. An amniocentesis to check for possible genetic problems. Fetal screens for spina bifida and Down syndrome. HOME CARE INSTRUCTIONS  Avoid all smoking, herbs, alcohol, and unprescribed drugs. These chemicals affect the formation and growth of the baby. Follow your health care provider's instructions regarding medicine use. There are medicines that are either safe or unsafe to take during pregnancy. Exercise only as directed by your health care provider. Experiencing uterine cramps is a good sign to stop exercising. Continue to eat regular, healthy meals. Wear a good support bra for breast tenderness. Do not use hot tubs, steam rooms, or saunas. Wear your seat belt at all times when driving. Avoid raw meat, uncooked cheese, cat litter boxes, and soil used by cats. These carry germs that can cause birth defects in the baby. Take your prenatal vitamins. Try taking a stool softener (if your health care provider approves) if you develop constipation. Eat more high-fiber foods, such as fresh vegetables or fruit and whole grains. Drink plenty of fluids to keep your urine clear or pale yellow. Take warm sitz baths to soothe any pain or discomfort caused by hemorrhoids. Use hemorrhoid cream if your health care provider approves. If you develop varicose veins, wear support hose. Elevate your feet for 15 minutes, 3-4 times a day. Limit salt in your diet. Avoid heavy lifting, wear low heel shoes, and practice good posture. Rest with your legs elevated if you have leg cramps or low back pain. Visit your dentist if you have not gone yet during your pregnancy. Use a soft toothbrush to brush your teeth  and be gentle when you floss. A sexual relationship may be continued unless your health care provider directs you otherwise. Continue to go to all your prenatal visits as directed by your health care provider. SEEK MEDICAL CARE IF:  You have dizziness. You have mild pelvic cramps, pelvic pressure, or nagging pain in the abdominal area. You have persistent nausea, vomiting, or diarrhea. You have a bad smelling vaginal discharge. You have pain with urination. SEEK IMMEDIATE MEDICAL CARE IF:  You have a fever. You are leaking fluid from your vagina. You have spotting or bleeding from your vagina. You have severe abdominal cramping or pain. You have rapid weight gain or loss. You have shortness of   breath with chest pain. You notice sudden or extreme swelling of your face, hands, ankles, feet, or legs. You have not felt your baby move in over an hour. You have severe headaches that do not go away with medicine. You have vision changes. Document Released: 10/29/2001 Document Revised: 11/09/2013 Document Reviewed: 01/05/2013 [facility] Patient Information 2015 [place], [place]. This information is not intended to replace advice given to you by your health care provider. Make sure you discuss any questions you have with your health care provider.

## 2021-08-21 ENCOUNTER — Encounter: Payer: Self-pay | Admitting: Women's Health

## 2021-08-21 ENCOUNTER — Other Ambulatory Visit: Payer: BC Managed Care – PPO

## 2021-08-21 ENCOUNTER — Ambulatory Visit (INDEPENDENT_AMBULATORY_CARE_PROVIDER_SITE_OTHER): Payer: BC Managed Care – PPO | Admitting: Women's Health

## 2021-08-21 ENCOUNTER — Other Ambulatory Visit: Payer: Self-pay

## 2021-08-21 VITALS — BP 116/75 | HR 104 | Wt 188.4 lb

## 2021-08-21 DIAGNOSIS — Z23 Encounter for immunization: Secondary | ICD-10-CM | POA: Diagnosis not present

## 2021-08-21 DIAGNOSIS — Z3403 Encounter for supervision of normal first pregnancy, third trimester: Secondary | ICD-10-CM

## 2021-08-21 DIAGNOSIS — Z3A28 28 weeks gestation of pregnancy: Secondary | ICD-10-CM

## 2021-08-21 NOTE — Progress Notes (Signed)
LOW-RISK PREGNANCY VISIT Patient name: Jill [name] MRN [id number]  Date of birth: [birth year] Chief Complaint:   Routine Prenatal Visit  History of Present Illness:   Jill [name] is a 25 y.o. G33P0000 female at [redacted]w[redacted]d with an Estimated Date of Delivery: 11/10/21 being seen today for ongoing management of a low-risk pregnancy.   Today she reports  varicose veins Lt lower leg, hurt at night . Contractions: Not present. Vag. Bleeding: None.  Movement: Present. denies leaking of fluid.  Depression screen [facility] 2/9 08/21/2021 05/11/2021 04/20/2020 04/08/2018 10/07/2017  Decreased Interest 0 0 0 0 0  Down, Depressed, Hopeless 0 0 0 0 0  PHQ - 2 Score 0 0 0 0 0  Altered sleeping 1 0 1 - -  Tired, decreased energy 1 1 1  - -  Change in appetite 0 0 0 - -  Feeling bad or failure about yourself  0 0 0 - -  Trouble concentrating 0 0 0 - -  Moving slowly or fidgety/restless 0 0 0 - -  Suicidal thoughts 0 0 0 - -  PHQ-9 Score 2 1 2  - -     GAD 7 : Generalized Anxiety Score 08/21/2021 04/20/2020  Nervous, Anxious, on Edge 0 0  Control/stop worrying 1 0  Worry too much - different things 1 0  Trouble relaxing 0 1  Restless 0 0  Easily annoyed or irritable 1 2  Afraid - awful might happen 0 0  Total GAD 7 Score 3 3      Review of Systems:   Pertinent items are noted in HPI Denies abnormal vaginal discharge w/ itching/odor/irritation, headaches, visual changes, shortness of breath, chest pain, abdominal pain, severe nausea/vomiting, or problems with urination or bowel movements unless otherwise stated above. Pertinent History Reviewed:  Reviewed past medical,surgical, social, obstetrical and family history.  Reviewed problem list, medications and allergies. Physical Assessment:   Vitals:   08/21/21 0853  BP: 116/75  Pulse: (!) 104  Weight: 188 lb 6.4 oz (85.5 kg)  Body mass index is 34.46 kg/m.        Physical Examination:   General appearance: Well appearing, and in no  distress  Mental status: Alert, oriented to person, place, and time  Skin: Warm & dry  Cardiovascular: Normal heart rate noted  Respiratory: Normal respiratory effort, no distress  Abdomen: Soft, gravid, nontender  Pelvic: Cervical exam deferred         Extremities: Edema: Trace, varicose & spider veins Lt lower leg  Fetal Status: Fetal Heart Rate (bpm): 148 Fundal Height: 29 cm Movement: Present    Chaperone: N/A   No results found for this or any previous visit (from the past 24 hour(s)).  Assessment & Plan:  1) Low-risk pregnancy G1P0000 at [redacted]w[redacted]d with an Estimated Date of Delivery: 11/10/21   2) Varicose & spider veins Lt lower leg, start ASA 81mg , compression stockings, elevate legs at night   Meds: No orders of the defined types were placed in this encounter.  Labs/procedures today: pn2, tdap  Plan:  Continue routine obstetrical care  Next visit: prefers in person    Reviewed: Preterm labor symptoms and general obstetric precautions including but not limited to vaginal bleeding, contractions, leaking of fluid and fetal movement were reviewed in detail with the patient.  All questions were answered. Does have home bp cuff. Office bp cuff given: not applicable. Check bp weekly, let [redacted]w[redacted]d know if consistently >140 and/or >90.  Follow-up: Return in about  4 weeks (around 09/18/2021) for LROB, CNM, in person.  No future appointments.   Orders Placed This Encounter  Procedures   Tdap vaccine greater than or equal to 7yo IM   Cheral Marker CNM, [facility] 08/21/2021 9:10 AM

## 2021-08-21 NOTE — Patient Instructions (Signed)
Jill [name], thank you for choosing our office today! We appreciate the opportunity to meet your healthcare needs. You may receive a short survey by mail, e-mail, or through [employer]. If you are happy with your care we would appreciate if you could take just a few minutes to complete the survey questions. We read all of your comments and take your feedback very seriously. Thank you again for choosing our office.  Center for [employer] Team at [facility]  [facility] at [facility] ([address] [place], [place] [address]) Entrance C, located off of [address] Free 24/7 valet parking   CLASSES: Go to [email or web address] to register for classes (childbirth, breastfeeding, waterbirth, infant CPR, daddy bootcamp, etc.)  Call the office [phone]) or go to [facility] if: You begin to have strong, frequent contractions Your water breaks.  Sometimes it is a big gush of fluid, sometimes it is just a trickle that keeps getting your panties wet or running down your legs You have vaginal bleeding.  It is normal to have a small amount of spotting if your cervix was checked.  You don't feel your baby moving like normal.  If you don't, get you something to eat and drink and lay down and focus on feeling your baby move.   If your baby is still not moving like normal, you should call the office or go to [facility].  Call the office [phone]) or go to [facility] hospital for these signs of pre-eclampsia: Severe headache that does not go away with Tylenol Visual changes- seeing spots, double, blurred vision Pain under your right breast or upper abdomen that does not go away with Tums or heartburn medicine Nausea and/or vomiting Severe swelling in your hands, feet, and face   Tdap Vaccine It is recommended that you get the Tdap vaccine during the third trimester of EACH pregnancy to help protect your baby from getting pertussis (whooping cough) 27-36 weeks is the BEST time to do  this so that you can pass the protection on to your baby. During pregnancy is better than after pregnancy, but if you are unable to get it during pregnancy it will be offered at the hospital.  You can get this vaccine with [place], at the health department, your family doctor, or some local pharmacies Everyone who will be around your baby should also be up-to-date on their vaccines before the baby comes. Adults (who are not pregnant) only need 1 dose of Tdap during adulthood.   [facility] Pediatricians/Family Doctors [facility]): [address] Colette Ribas, [phone]           [facility]: [address], [phone]                [facility]): [address], [phone] (call to ask if accepting patients) [facility] Department: [address], [place], [phone]    [facility] Pediatricians/Family Doctors [facility]): [phone] [address], [phone] [facility]: [address] [place], [phone] [facility]: [address], [phone]  [facility] Doctors  [facility]): [phone] [facility]: [address], [phone]   [facility] Doctors [facility]: [address] [phone]  [facility] Family Doctors  [facility]: [phone], [phone]  Home Blood Pressure Monitoring for Patients   Your provider has recommended that you check your  blood pressure (BP) at least once a week at home. If you do not have a blood pressure cuff at home, one will be provided for you. Contact your provider if you have not received your monitor within 1 week.   Helpful Tips for Accurate Home Blood Pressure Checks  Don't smoke, exercise, or drink caffeine 30 minutes before checking your BP Use the restroom before checking your BP (a full bladder can raise your  pressure) Relax in a comfortable upright chair Feet on the ground Left arm resting comfortably on a flat surface at the level of your heart Legs uncrossed Back supported Sit quietly and don't talk Place the cuff on your bare arm Adjust snuggly, so that only two fingertips can fit between your skin and the top of the cuff Check 2 readings separated by at least one minute Keep a log of your BP readings For a visual, please reference this diagram: http://ccnc.care/bpdiagram  Provider Name: [facility]     Phone: [phone]  Zone 1: ALL CLEAR  Continue to monitor your symptoms:  BP reading is less than 140 (top number) or less than 90 (bottom number)  No right upper stomach pain No headaches or seeing spots No feeling nauseated or throwing up No swelling in face and hands  Zone 2: CAUTION Call your doctor's office for any of the following:  BP reading is greater than 140 (top number) or greater than 90 (bottom number)  Stomach pain under your ribs in the middle or right side Headaches or seeing spots Feeling nauseated or throwing up Swelling in face and hands  Zone 3: EMERGENCY  Seek immediate medical care if you have any of the following:  BP reading is greater than160 (top number) or greater than 110 (bottom number) Severe headaches not improving with Tylenol Serious difficulty catching your breath Any worsening symptoms from Zone 2   Third Trimester of Pregnancy The third trimester is from week 29 through week 42, months 7 through 9. The third trimester is a time when the fetus is growing rapidly. At the end of the ninth month, the fetus is about 20 inches in length and weighs 6-10 pounds.  BODY CHANGES Your body goes through many changes during pregnancy. The changes vary from woman to woman.  Your weight will continue to increase. You can expect to gain 25-35 pounds (11-16 kg) by the end of the pregnancy. You may begin to get stretch marks on your hips, abdomen,  and breasts. You may urinate more often because the fetus is moving lower into your pelvis and pressing on your bladder. You may develop or continue to have heartburn as a result of your pregnancy. You may develop constipation because certain hormones are causing the muscles that push waste through your intestines to slow down. You may develop hemorrhoids or swollen, bulging veins (varicose veins). You may have pelvic pain because of the weight gain and pregnancy hormones relaxing your joints between the bones in your pelvis. Backaches may result from overexertion of the muscles supporting your posture. You may have changes in your hair. These can include thickening of your hair, rapid growth, and changes in texture. Some women also have hair loss during or after pregnancy, or hair that feels dry or thin. Your hair will most likely return to normal after your baby is born. Your breasts will continue to grow and be tender. A yellow discharge may leak from your breasts called colostrum. Your belly button may stick out. You may   feel short of breath because of your expanding uterus. You may notice the fetus "dropping," or moving lower in your abdomen. You may have a bloody mucus discharge. This usually occurs a few days to a week before labor begins. Your cervix becomes thin and soft (effaced) near your due date. WHAT TO EXPECT AT YOUR PRENATAL EXAMS  You will have prenatal exams every 2 weeks until week 36. Then, you will have weekly prenatal exams. During a routine prenatal visit: You will be weighed to make sure you and the fetus are growing normally. Your blood pressure is taken. Your abdomen will be measured to track your baby's growth. The fetal heartbeat will be listened to. Any test results from the previous visit will be discussed. You may have a cervical check near your due date to see if you have effaced. At around 36 weeks, your caregiver will check your cervix. At the same time, your  caregiver will also perform a test on the secretions of the vaginal tissue. This test is to determine if a type of bacteria, Group B streptococcus, is present. Your caregiver will explain this further. Your caregiver may ask you: What your birth plan is. How you are feeling. If you are feeling the baby move. If you have had any abnormal symptoms, such as leaking fluid, bleeding, severe headaches, or abdominal cramping. If you have any questions. Other tests or screenings that may be performed during your third trimester include: Blood tests that check for low iron levels (anemia). Fetal testing to check the health, activity level, and growth of the fetus. Testing is done if you have certain medical conditions or if there are problems during the pregnancy. FALSE LABOR You may feel small, irregular contractions that eventually go away. These are called Braxton Hicks contractions, or false labor. Contractions may last for hours, days, or even weeks before true labor sets in. If contractions come at regular intervals, intensify, or become painful, it is best to be seen by your caregiver.  SIGNS OF LABOR  Menstrual-like cramps. Contractions that are 5 minutes apart or less. Contractions that start on the top of the uterus and spread down to the lower abdomen and back. A sense of increased pelvic pressure or back pain. A watery or bloody mucus discharge that comes from the vagina. If you have any of these signs before the 37th week of pregnancy, call your caregiver right away. You need to go to the hospital to get checked immediately. HOME CARE INSTRUCTIONS  Avoid all smoking, herbs, alcohol, and unprescribed drugs. These chemicals affect the formation and growth of the baby. Follow your caregiver's instructions regarding medicine use. There are medicines that are either safe or unsafe to take during pregnancy. Exercise only as directed by your caregiver. Experiencing uterine cramps is a good sign to  stop exercising. Continue to eat regular, healthy meals. Wear a good support bra for breast tenderness. Do not use hot tubs, steam rooms, or saunas. Wear your seat belt at all times when driving. Avoid raw meat, uncooked cheese, cat litter boxes, and soil used by cats. These carry germs that can cause birth defects in the baby. Take your prenatal vitamins. Try taking a stool softener (if your caregiver approves) if you develop constipation. Eat more high-fiber foods, such as fresh vegetables or fruit and whole grains. Drink plenty of fluids to keep your urine clear or pale yellow. Take warm sitz baths to soothe any pain or discomfort caused by hemorrhoids. Use hemorrhoid cream if   your caregiver approves. If you develop varicose veins, wear support hose. Elevate your feet for 15 minutes, 3-4 times a day. Limit salt in your diet. Avoid heavy lifting, wear low heal shoes, and practice good posture. Rest a lot with your legs elevated if you have leg cramps or low back pain. Visit your dentist if you have not gone during your pregnancy. Use a soft toothbrush to brush your teeth and be gentle when you floss. A sexual relationship may be continued unless your caregiver directs you otherwise. Do not travel far distances unless it is absolutely necessary and only with the approval of your caregiver. Take prenatal classes to understand, practice, and ask questions about the labor and delivery. Make a trial run to the hospital. Pack your hospital bag. Prepare the baby's nursery. Continue to go to all your prenatal visits as directed by your caregiver. SEEK MEDICAL CARE IF: You are unsure if you are in labor or if your water has broken. You have dizziness. You have mild pelvic cramps, pelvic pressure, or nagging pain in your abdominal area. You have persistent nausea, vomiting, or diarrhea. You have a bad smelling vaginal discharge. You have pain with urination. SEEK IMMEDIATE MEDICAL CARE IF:  You  have a fever. You are leaking fluid from your vagina. You have spotting or bleeding from your vagina. You have severe abdominal cramping or pain. You have rapid weight loss or gain. You have shortness of breath with chest pain. You notice sudden or extreme swelling of your face, hands, ankles, feet, or legs. You have not felt your baby move in over an hour. You have severe headaches that do not go away with medicine. You have vision changes. Document Released: 10/29/2001 Document Revised: 11/09/2013 Document Reviewed: 01/05/2013 ExitCare Patient Information 2015 [employer]. This information is not intended to replace advice given to you by your health care provider. Make sure you discuss any questions you have with your health care provider.       

## 2021-08-22 LAB — GLUCOSE TOLERANCE, 2 HOURS W/ 1HR
Glucose, 1 hour: 131 mg/dL (ref 65–179)
Glucose, 2 hour: 143 mg/dL (ref 65–152)
Glucose, Fasting: 82 mg/dL (ref 65–91)

## 2021-08-22 LAB — CBC
Hematocrit: 35.2 % (ref 34.0–46.6)
Hemoglobin: 11.5 g/dL (ref 11.1–15.9)
MCH: 29.7 pg (ref 26.6–33.0)
MCHC: 32.7 g/dL (ref 31.5–35.7)
MCV: 91 fL (ref 79–97)
Platelets: 263 10*3/uL (ref 150–450)
RBC: 3.87 x10E6/uL (ref 3.77–5.28)
RDW: 12.2 % (ref 11.7–15.4)
WBC: 10.7 10*3/uL (ref 3.4–10.8)

## 2021-08-22 LAB — HIV ANTIBODY (ROUTINE TESTING W REFLEX): HIV Screen 4th Generation wRfx: NONREACTIVE

## 2021-08-22 LAB — RPR: RPR Ser Ql: NONREACTIVE

## 2021-08-22 LAB — ANTIBODY SCREEN: Antibody Screen: NEGATIVE

## 2021-09-12 ENCOUNTER — Other Ambulatory Visit: Payer: Self-pay

## 2021-09-12 ENCOUNTER — Ambulatory Visit (INDEPENDENT_AMBULATORY_CARE_PROVIDER_SITE_OTHER): Payer: BC Managed Care – PPO | Admitting: *Deleted

## 2021-09-12 VITALS — BP 108/70 | HR 85 | Wt 194.0 lb

## 2021-09-12 DIAGNOSIS — Z3403 Encounter for supervision of normal first pregnancy, third trimester: Secondary | ICD-10-CM

## 2021-09-12 NOTE — Progress Notes (Addendum)
   NURSE VISIT- NST  SUBJECTIVE:  Jill [name] is a 25 y.o. G1P0000 female at [redacted]w[redacted]d, here for a NST for pregnancy complicated by Decreased fetal movement.  She reports decreased  fetal movement, contractions: none, vaginal bleeding: none, membranes: intact.   OBJECTIVE:  BP 108/70   Pulse 85   Wt 194 lb (88 kg)   LMP 02/03/2021   BMI 35.48 kg/m   Appears well, no apparent distress  No results found for this or any previous visit (from the past 24 hour(s)).  NST: FHR baseline 150 bpm, Variability: moderate, Accelerations:present, Decelerations:  Absent= Cat 1/reactive Toco: none   ASSESSMENT: G1P0000 at [redacted]w[redacted]d with Decreased fetal movement NST reactive  PLAN: EFM strip reviewed by Dr. Charlotta Newton   Recommendations: keep next appointment as scheduled    Annamarie Dawley  09/12/2021 2:59 PM   Chart reviewed for nurse visit. Agree with plan of care.  Myna Hidalgo, DO 09/22/2021 8:34 PM

## 2021-09-19 ENCOUNTER — Ambulatory Visit (INDEPENDENT_AMBULATORY_CARE_PROVIDER_SITE_OTHER): Payer: BC Managed Care – PPO | Admitting: Advanced Practice Midwife

## 2021-09-19 ENCOUNTER — Other Ambulatory Visit: Payer: Self-pay

## 2021-09-19 VITALS — BP 131/74 | HR 99 | Wt 196.0 lb

## 2021-09-19 DIAGNOSIS — Z3403 Encounter for supervision of normal first pregnancy, third trimester: Secondary | ICD-10-CM

## 2021-09-19 DIAGNOSIS — Z3A32 32 weeks gestation of pregnancy: Secondary | ICD-10-CM

## 2021-09-19 NOTE — Progress Notes (Signed)
   LOW-RISK PREGNANCY VISIT Patient name: Jill [name] MRN [id number]  Date of birth: 08-20-1996 Chief Complaint:   Routine Prenatal Visit  History of Present Illness:   Jill [name] is a 25 y.o. G83P0000 female at [redacted]w[redacted]d with an Estimated Date of Delivery: 11/10/21 being seen today for ongoing management of a low-risk pregnancy.  Today she reports  intermittent bilat low abd pain . Contractions: Not present. Vag. Bleeding: None.  Movement: Present. denies leaking of fluid. Review of Systems:   Pertinent items are noted in HPI Denies abnormal vaginal discharge w/ itching/odor/irritation, headaches, visual changes, shortness of breath, chest pain, abdominal pain, severe nausea/vomiting, or problems with urination or bowel movements unless otherwise stated above. Pertinent History Reviewed:  Reviewed past medical,surgical, social, obstetrical and family history.  Reviewed problem list, medications and allergies. Physical Assessment:   Vitals:   09/19/21 0830  BP: 131/74  Pulse: 99  Weight: 196 lb (88.9 kg)  Body mass index is 35.85 kg/m.        Physical Examination:   General appearance: Well appearing, and in no distress  Mental status: Alert, oriented to person, place, and time  Skin: Warm & dry  Cardiovascular: Normal heart rate noted  Respiratory: Normal respiratory effort, no distress  Abdomen: Soft, gravid, nontender  Pelvic: Cervical exam deferred         Extremities: Edema: Trace  Fetal Status: Fetal Heart Rate (bpm): 159 Fundal Height: 32 cm Movement: Present    No results found for this or any previous visit (from the past 24 hour(s)).  Assessment & Plan:  1) Low-risk pregnancy G1P0000 at [redacted]w[redacted]d with an Estimated Date of Delivery: 11/10/21   2) Round lig discomfort, reassured   Meds: No orders of the defined types were placed in this encounter.  Labs/procedures today: none  Plan:  Continue routine obstetrical care   Reviewed: Preterm labor symptoms and  general obstetric precautions including but not limited to vaginal bleeding, contractions, leaking of fluid and fetal movement were reviewed in detail with the patient.  All questions were answered. Has home bp cuff.  Check bp weekly, let us know if >140/90.   Follow-up: Return in about 2 weeks (around 10/03/2021) for LROB, in person.  No orders of the defined types were placed in this encounter.  Arabella Merles CNM 09/19/2021 9:00 AM

## 2021-09-19 NOTE — Patient Instructions (Signed)
Jill [name], thank you for choosing our office today! We appreciate the opportunity to meet your healthcare needs. You may receive a short survey by mail, e-mail, or through [employer]. If you are happy with your care we would appreciate if you could take just a few minutes to complete the survey questions. We read all of your comments and take your feedback very seriously. Thank you again for choosing our office.  Center for [employer] Team at [facility]  [facility] at [facility] ([address] [place], [place] [address]) Entrance C, located off of [address] Free 24/7 valet parking   CLASSES: Go to [email or web address] to register for classes (childbirth, breastfeeding, waterbirth, infant CPR, daddy bootcamp, etc.)  Call the office [phone]) or go to [facility] if: You begin to have strong, frequent contractions Your water breaks.  Sometimes it is a big gush of fluid, sometimes it is just a trickle that keeps getting your panties wet or running down your legs You have vaginal bleeding.  It is normal to have a small amount of spotting if your cervix was checked.  You don't feel your baby moving like normal.  If you don't, get you something to eat and drink and lay down and focus on feeling your baby move.   If your baby is still not moving like normal, you should call the office or go to [facility].  Call the office [phone]) or go to [facility] hospital for these signs of pre-eclampsia: Severe headache that does not go away with Tylenol Visual changes- seeing spots, double, blurred vision Pain under your right breast or upper abdomen that does not go away with Tums or heartburn medicine Nausea and/or vomiting Severe swelling in your hands, feet, and face   Tdap Vaccine It is recommended that you get the Tdap vaccine during the third trimester of EACH pregnancy to help protect your baby from getting pertussis (whooping cough) 27-36 weeks is the BEST time to do  this so that you can pass the protection on to your baby. During pregnancy is better than after pregnancy, but if you are unable to get it during pregnancy it will be offered at the hospital.  You can get this vaccine with [place], at the health department, your family doctor, or some local pharmacies Everyone who will be around your baby should also be up-to-date on their vaccines before the baby comes. Adults (who are not pregnant) only need 1 dose of Tdap during adulthood.   [facility] Pediatricians/Family Doctors [facility]): [address] Colette Ribas, [phone]           [facility]: [address], [phone]                [facility]): [address], [phone] (call to ask if accepting patients) [facility] Department: [address], [place], [phone]    [facility] Pediatricians/Family Doctors [facility]): [phone] [address], [phone] [facility]: [address] [place], [phone] [facility]: [address], [phone]  [facility] Doctors  [facility]): [phone] [facility]: [address], [phone]   [facility] Doctors [facility]: [address] [phone]  [facility] Family Doctors  [facility]: [phone], [phone]  Home Blood Pressure Monitoring for Patients   Your provider has recommended that you check your  blood pressure (BP) at least once a week at home. If you do not have a blood pressure cuff at home, one will be provided for you. Contact your provider if you have not received your monitor within 1 week.   Helpful Tips for Accurate Home Blood Pressure Checks  Don't smoke, exercise, or drink caffeine 30 minutes before checking your BP Use the restroom before checking your BP (a full bladder can raise your  pressure) Relax in a comfortable upright chair Feet on the ground Left arm resting comfortably on a flat surface at the level of your heart Legs uncrossed Back supported Sit quietly and don't talk Place the cuff on your bare arm Adjust snuggly, so that only two fingertips can fit between your skin and the top of the cuff Check 2 readings separated by at least one minute Keep a log of your BP readings For a visual, please reference this diagram: http://ccnc.care/bpdiagram  Provider Name: [facility]     Phone: [phone]  Zone 1: ALL CLEAR  Continue to monitor your symptoms:  BP reading is less than 140 (top number) or less than 90 (bottom number)  No right upper stomach pain No headaches or seeing spots No feeling nauseated or throwing up No swelling in face and hands  Zone 2: CAUTION Call your doctor's office for any of the following:  BP reading is greater than 140 (top number) or greater than 90 (bottom number)  Stomach pain under your ribs in the middle or right side Headaches or seeing spots Feeling nauseated or throwing up Swelling in face and hands  Zone 3: EMERGENCY  Seek immediate medical care if you have any of the following:  BP reading is greater than160 (top number) or greater than 110 (bottom number) Severe headaches not improving with Tylenol Serious difficulty catching your breath Any worsening symptoms from Zone 2   Third Trimester of Pregnancy The third trimester is from week 29 through week 42, months 7 through 9. The third trimester is a time when the fetus is growing rapidly. At the end of the ninth month, the fetus is about 20 inches in length and weighs 6-10 pounds.  BODY CHANGES Your body goes through many changes during pregnancy. The changes vary from woman to woman.  Your weight will continue to increase. You can expect to gain 25-35 pounds (11-16 kg) by the end of the pregnancy. You may begin to get stretch marks on your hips, abdomen,  and breasts. You may urinate more often because the fetus is moving lower into your pelvis and pressing on your bladder. You may develop or continue to have heartburn as a result of your pregnancy. You may develop constipation because certain hormones are causing the muscles that push waste through your intestines to slow down. You may develop hemorrhoids or swollen, bulging veins (varicose veins). You may have pelvic pain because of the weight gain and pregnancy hormones relaxing your joints between the bones in your pelvis. Backaches may result from overexertion of the muscles supporting your posture. You may have changes in your hair. These can include thickening of your hair, rapid growth, and changes in texture. Some women also have hair loss during or after pregnancy, or hair that feels dry or thin. Your hair will most likely return to normal after your baby is born. Your breasts will continue to grow and be tender. A yellow discharge may leak from your breasts called colostrum. Your belly button may stick out. You may   feel short of breath because of your expanding uterus. You may notice the fetus "dropping," or moving lower in your abdomen. You may have a bloody mucus discharge. This usually occurs a few days to a week before labor begins. Your cervix becomes thin and soft (effaced) near your due date. WHAT TO EXPECT AT YOUR PRENATAL EXAMS  You will have prenatal exams every 2 weeks until week 36. Then, you will have weekly prenatal exams. During a routine prenatal visit: You will be weighed to make sure you and the fetus are growing normally. Your blood pressure is taken. Your abdomen will be measured to track your baby's growth. The fetal heartbeat will be listened to. Any test results from the previous visit will be discussed. You may have a cervical check near your due date to see if you have effaced. At around 36 weeks, your caregiver will check your cervix. At the same time, your  caregiver will also perform a test on the secretions of the vaginal tissue. This test is to determine if a type of bacteria, Group B streptococcus, is present. Your caregiver will explain this further. Your caregiver may ask you: What your birth plan is. How you are feeling. If you are feeling the baby move. If you have had any abnormal symptoms, such as leaking fluid, bleeding, severe headaches, or abdominal cramping. If you have any questions. Other tests or screenings that may be performed during your third trimester include: Blood tests that check for low iron levels (anemia). Fetal testing to check the health, activity level, and growth of the fetus. Testing is done if you have certain medical conditions or if there are problems during the pregnancy. FALSE LABOR You may feel small, irregular contractions that eventually go away. These are called Braxton Hicks contractions, or false labor. Contractions may last for hours, days, or even weeks before true labor sets in. If contractions come at regular intervals, intensify, or become painful, it is best to be seen by your caregiver.  SIGNS OF LABOR  Menstrual-like cramps. Contractions that are 5 minutes apart or less. Contractions that start on the top of the uterus and spread down to the lower abdomen and back. A sense of increased pelvic pressure or back pain. A watery or bloody mucus discharge that comes from the vagina. If you have any of these signs before the 37th week of pregnancy, call your caregiver right away. You need to go to the hospital to get checked immediately. HOME CARE INSTRUCTIONS  Avoid all smoking, herbs, alcohol, and unprescribed drugs. These chemicals affect the formation and growth of the baby. Follow your caregiver's instructions regarding medicine use. There are medicines that are either safe or unsafe to take during pregnancy. Exercise only as directed by your caregiver. Experiencing uterine cramps is a good sign to  stop exercising. Continue to eat regular, healthy meals. Wear a good support bra for breast tenderness. Do not use hot tubs, steam rooms, or saunas. Wear your seat belt at all times when driving. Avoid raw meat, uncooked cheese, cat litter boxes, and soil used by cats. These carry germs that can cause birth defects in the baby. Take your prenatal vitamins. Try taking a stool softener (if your caregiver approves) if you develop constipation. Eat more high-fiber foods, such as fresh vegetables or fruit and whole grains. Drink plenty of fluids to keep your urine clear or pale yellow. Take warm sitz baths to soothe any pain or discomfort caused by hemorrhoids. Use hemorrhoid cream if   your caregiver approves. If you develop varicose veins, wear support hose. Elevate your feet for 15 minutes, 3-4 times a day. Limit salt in your diet. Avoid heavy lifting, wear low heal shoes, and practice good posture. Rest a lot with your legs elevated if you have leg cramps or low back pain. Visit your dentist if you have not gone during your pregnancy. Use a soft toothbrush to brush your teeth and be gentle when you floss. A sexual relationship may be continued unless your caregiver directs you otherwise. Do not travel far distances unless it is absolutely necessary and only with the approval of your caregiver. Take prenatal classes to understand, practice, and ask questions about the labor and delivery. Make a trial run to the hospital. Pack your hospital bag. Prepare the baby's nursery. Continue to go to all your prenatal visits as directed by your caregiver. SEEK MEDICAL CARE IF: You are unsure if you are in labor or if your water has broken. You have dizziness. You have mild pelvic cramps, pelvic pressure, or nagging pain in your abdominal area. You have persistent nausea, vomiting, or diarrhea. You have a bad smelling vaginal discharge. You have pain with urination. SEEK IMMEDIATE MEDICAL CARE IF:  You  have a fever. You are leaking fluid from your vagina. You have spotting or bleeding from your vagina. You have severe abdominal cramping or pain. You have rapid weight loss or gain. You have shortness of breath with chest pain. You notice sudden or extreme swelling of your face, hands, ankles, feet, or legs. You have not felt your baby move in over an hour. You have severe headaches that do not go away with medicine. You have vision changes. Document Released: 10/29/2001 Document Revised: 11/09/2013 Document Reviewed: 01/05/2013 [facility] Patient Information 2015 [place], [place]. This information is not intended to replace advice given to you by your health care provider. Make sure you discuss any questions you have with your health care provider.

## 2021-10-01 ENCOUNTER — Other Ambulatory Visit: Payer: Self-pay

## 2021-10-01 ENCOUNTER — Ambulatory Visit (INDEPENDENT_AMBULATORY_CARE_PROVIDER_SITE_OTHER): Payer: BC Managed Care – PPO | Admitting: Obstetrics & Gynecology

## 2021-10-01 ENCOUNTER — Encounter: Payer: Self-pay | Admitting: Obstetrics & Gynecology

## 2021-10-01 VITALS — BP 113/77 | HR 104 | Wt 198.0 lb

## 2021-10-01 DIAGNOSIS — Z3403 Encounter for supervision of normal first pregnancy, third trimester: Secondary | ICD-10-CM

## 2021-10-01 NOTE — Progress Notes (Signed)
   LOW-RISK PREGNANCY VISIT Patient name: Jill [name] MRN [id number]  Date of birth: May 17, 1996 Chief Complaint:   Routine Prenatal Visit  History of Present Illness:   Jill [name] is a 25 y.o. G60P0000 female at [redacted]w[redacted]d with an Estimated Date of Delivery: 11/10/21 being seen today for ongoing management of a low-risk pregnancy.  Depression screen [facility] 2/9 08/21/2021 05/11/2021 04/20/2020 04/08/2018 10/07/2017  Decreased Interest 0 0 0 0 0  Down, Depressed, Hopeless 0 0 0 0 0  PHQ - 2 Score 0 0 0 0 0  Altered sleeping 1 0 1 - -  Tired, decreased energy 1 1 1  - -  Change in appetite 0 0 0 - -  Feeling bad or failure about yourself  0 0 0 - -  Trouble concentrating 0 0 0 - -  Moving slowly or fidgety/restless 0 0 0 - -  Suicidal thoughts 0 0 0 - -  PHQ-9 Score 2 1 2  - -    Today she reports no complaints. Contractions: Irritability. Vag. Bleeding: None.  Movement: Present. denies leaking of fluid. Review of Systems:   Pertinent items are noted in HPI Denies abnormal vaginal discharge w/ itching/odor/irritation, headaches, visual changes, shortness of breath, chest pain, abdominal pain, severe nausea/vomiting, or problems with urination or bowel movements unless otherwise stated above. Pertinent History Reviewed:  Reviewed past medical,surgical, social, obstetrical and family history.  Reviewed problem list, medications and allergies. Physical Assessment:   Vitals:   10/01/21 1509  BP: 113/77  Pulse: (!) 104  Weight: 198 lb (89.8 kg)  Body mass index is 36.21 kg/m.        Physical Examination:   General appearance: Well appearing, and in no distress  Mental status: Alert, oriented to person, place, and time  Skin: Warm & dry  Cardiovascular: Normal heart rate noted  Respiratory: Normal respiratory effort, no distress  Abdomen: Soft, gravid, nontender  Pelvic: Cervical exam deferred         Extremities: Edema: Trace  Fetal Status: Fetal Heart Rate (bpm): 155 Fundal Height:  34 cm Movement: Present    Chaperone: n/a    No results found for this or any previous visit (from the past 24 hour(s)).  Assessment & Plan:  1) Low-risk pregnancy G1P0000 at [redacted]w[redacted]d with an Estimated Date of Delivery: 11/10/21      Meds: No orders of the defined types were placed in this encounter.  Labs/procedures today:   Plan:  Continue routine obstetrical care  Next visit: prefers in person    Reviewed: Preterm labor symptoms and general obstetric precautions including but not limited to vaginal bleeding, contractions, leaking of fluid and fetal movement were reviewed in detail with the patient.  All questions were answered. Has home bp cuff. Rx faxed to . Check bp daily let [redacted]w[redacted]d know if >140/90.   Follow-up: Return in about 2 weeks (around 10/15/2021) for LROB.  No orders of the defined types were placed in this encounter.   [place], MD 10/01/2021 3:47 PM

## 2021-10-16 ENCOUNTER — Other Ambulatory Visit (HOSPITAL_COMMUNITY)
Admission: RE | Admit: 2021-10-16 | Discharge: 2021-10-16 | Disposition: A | Discharge status: Home / Self Care | Payer: BC Managed Care – PPO | Source: Ambulatory Visit | Attending: Obstetrics & Gynecology | Admitting: Obstetrics & Gynecology

## 2021-10-16 ENCOUNTER — Other Ambulatory Visit: Payer: Self-pay

## 2021-10-16 ENCOUNTER — Ambulatory Visit (INDEPENDENT_AMBULATORY_CARE_PROVIDER_SITE_OTHER): Payer: BC Managed Care – PPO | Admitting: Obstetrics & Gynecology

## 2021-10-16 ENCOUNTER — Encounter: Payer: Self-pay | Admitting: Obstetrics & Gynecology

## 2021-10-16 VITALS — BP 121/72 | HR 98 | Wt 199.4 lb

## 2021-10-16 DIAGNOSIS — Z3403 Encounter for supervision of normal first pregnancy, third trimester: Secondary | ICD-10-CM | POA: Insufficient documentation

## 2021-10-16 DIAGNOSIS — Z3A36 36 weeks gestation of pregnancy: Secondary | ICD-10-CM | POA: Insufficient documentation

## 2021-10-16 LAB — OB RESULTS CONSOLE GC/CHLAMYDIA: Gonorrhea: NEGATIVE

## 2021-10-16 NOTE — Progress Notes (Signed)
   LOW-RISK PREGNANCY VISIT Patient name: Jill [name] MRN [id number]  Date of birth: 1996-02-06 Chief Complaint:   Routine Prenatal Visit  History of Present Illness:   Jill [name] is a 25 y.o. G52P0000 female at [redacted]w[redacted]d with an Estimated Date of Delivery: 11/10/21 being seen today for ongoing management of a low-risk pregnancy.   Depression screen [facility] 2/9 08/21/2021 05/11/2021 04/20/2020 04/08/2018 10/07/2017  Decreased Interest 0 0 0 0 0  Down, Depressed, Hopeless 0 0 0 0 0  PHQ - 2 Score 0 0 0 0 0  Altered sleeping 1 0 1 - -  Tired, decreased energy 1 1 1  - -  Change in appetite 0 0 0 - -  Feeling bad or failure about yourself  0 0 0 - -  Trouble concentrating 0 0 0 - -  Moving slowly or fidgety/restless 0 0 0 - -  Suicidal thoughts 0 0 0 - -  PHQ-9 Score 2 1 2  - -    Today she reports no complaints. Contractions: Irritability. Vag. Bleeding: None.  Movement: Present. denies leaking of fluid. Review of Systems:   Pertinent items are noted in HPI Denies abnormal vaginal discharge w/ itching/odor/irritation, headaches, visual changes, shortness of breath, chest pain, abdominal pain, severe nausea/vomiting, or problems with urination or bowel movements unless otherwise stated above. Pertinent History Reviewed:  Reviewed past medical,surgical, social, obstetrical and family history.  Reviewed problem list, medications and allergies.  Physical Assessment:   Vitals:   10/16/21 0855  BP: 121/72  Pulse: 98  Weight: 199 lb 6.4 oz (90.4 kg)  Body mass index is 36.47 kg/m.        Physical Examination:   General appearance: Well appearing, and in no distress  Mental status: Alert, oriented to person, place, and time  Skin: Warm & dry  Respiratory: Normal respiratory effort, no distress  Abdomen: Soft, gravid, nontender  Pelvic: Cervical exam deferred         Extremities: Edema: Trace  Psych:  mood and affect appropriate  Fetal Status: Fetal Heart Rate (bpm): 155 Fundal  Height: 35 cm Movement: Present    Chaperone:  pt's husband     No results found for this or any previous visit (from the past 24 hour(s)).   Assessment & Plan:  1) Low-risk pregnancy G1P0000 at [redacted]w[redacted]d with an Estimated Date of Delivery: 11/10/21     Meds: No orders of the defined types were placed in this encounter.  Labs/procedures today: GBS/GC/C collected  Plan:  Continue routine obstetrical care  Next visit: prefers in person    Reviewed: Preterm labor symptoms and general obstetric precautions including but not limited to vaginal bleeding, contractions, leaking of fluid and fetal movement were reviewed in detail with the patient.  All questions were answered. Pt has home bp cuff. Check bp weekly, let [redacted]w[redacted]d know if >140/90.   Follow-up: Return in about 1 week (around 10/23/2021) for LROB visit.  Orders Placed This Encounter  Procedures   Culture, beta strep (group b only)    [place], DO Attending Obstetrician & Gynecologist, Faculty Practice Center for 14/04/2021, [facility]

## 2021-10-17 LAB — CERVICOVAGINAL ANCILLARY ONLY
Chlamydia: NEGATIVE
Comment: NEGATIVE
Comment: NORMAL
Neisseria Gonorrhea: NEGATIVE

## 2021-10-19 ENCOUNTER — Encounter (HOSPITAL_COMMUNITY): Payer: Self-pay | Admitting: Obstetrics & Gynecology

## 2021-10-19 ENCOUNTER — Inpatient Hospital Stay (EMERGENCY_DEPARTMENT_HOSPITAL)
Admission: AD | Admit: 2021-10-19 | Discharge: 2021-10-20 | Disposition: A | Discharge status: Home / Self Care | Payer: BC Managed Care – PPO | Source: Home / Self Care | Attending: Obstetrics & Gynecology | Admitting: Obstetrics & Gynecology

## 2021-10-19 DIAGNOSIS — R1011 Right upper quadrant pain: Secondary | ICD-10-CM | POA: Insufficient documentation

## 2021-10-19 DIAGNOSIS — O212 Late vomiting of pregnancy: Secondary | ICD-10-CM | POA: Insufficient documentation

## 2021-10-19 DIAGNOSIS — Z3689 Encounter for other specified antenatal screening: Secondary | ICD-10-CM | POA: Insufficient documentation

## 2021-10-19 DIAGNOSIS — O26893 Other specified pregnancy related conditions, third trimester: Secondary | ICD-10-CM | POA: Insufficient documentation

## 2021-10-19 DIAGNOSIS — R109 Unspecified abdominal pain: Secondary | ICD-10-CM

## 2021-10-19 DIAGNOSIS — Z3A37 37 weeks gestation of pregnancy: Secondary | ICD-10-CM

## 2021-10-19 DIAGNOSIS — M25519 Pain in unspecified shoulder: Secondary | ICD-10-CM | POA: Insufficient documentation

## 2021-10-19 DIAGNOSIS — R748 Abnormal levels of other serum enzymes: Secondary | ICD-10-CM | POA: Insufficient documentation

## 2021-10-19 DIAGNOSIS — O26899 Other specified pregnancy related conditions, unspecified trimester: Secondary | ICD-10-CM

## 2021-10-19 NOTE — MAU Note (Signed)
..  Jill [name] is a 25 y.o. at [redacted]w[redacted]d here in MAU reporting: stabbing epigastric pain that began 1.5hrs ago and has not gone away.  Denies vaginal bleeding, leaking of fluid, or contractions.  +FM   Pain score: 10/10 Vitals:   10/19/21 2332  BP: 119/68  Pulse: 67  Resp: (!) 22  Temp: 97.9 F (36.6 C)  SpO2: 100%     FHT:138 Lab orders placed from triage:  UA

## 2021-10-20 ENCOUNTER — Encounter (HOSPITAL_COMMUNITY): Payer: Self-pay | Admitting: Obstetrics & Gynecology

## 2021-10-20 ENCOUNTER — Inpatient Hospital Stay (HOSPITAL_COMMUNITY): Payer: BC Managed Care – PPO

## 2021-10-20 ENCOUNTER — Inpatient Hospital Stay (HOSPITAL_COMMUNITY)
Admission: AD | Admit: 2021-10-20 | Discharge: 2021-10-22 | DRG: 831 | Disposition: A | Discharge status: Home / Self Care | Payer: BC Managed Care – PPO | Attending: Family Medicine | Admitting: Family Medicine

## 2021-10-20 ENCOUNTER — Other Ambulatory Visit: Payer: Self-pay

## 2021-10-20 DIAGNOSIS — Z3A37 37 weeks gestation of pregnancy: Secondary | ICD-10-CM

## 2021-10-20 DIAGNOSIS — O2693 Pregnancy related conditions, unspecified, third trimester: Secondary | ICD-10-CM

## 2021-10-20 DIAGNOSIS — R748 Abnormal levels of other serum enzymes: Secondary | ICD-10-CM

## 2021-10-20 DIAGNOSIS — K858 Other acute pancreatitis without necrosis or infection: Secondary | ICD-10-CM

## 2021-10-20 DIAGNOSIS — O99013 Anemia complicating pregnancy, third trimester: Secondary | ICD-10-CM | POA: Diagnosis present

## 2021-10-20 DIAGNOSIS — O99613 Diseases of the digestive system complicating pregnancy, third trimester: Principal | ICD-10-CM | POA: Diagnosis present

## 2021-10-20 DIAGNOSIS — Z3403 Encounter for supervision of normal first pregnancy, third trimester: Secondary | ICD-10-CM

## 2021-10-20 DIAGNOSIS — K859 Acute pancreatitis without necrosis or infection, unspecified: Secondary | ICD-10-CM | POA: Diagnosis present

## 2021-10-20 DIAGNOSIS — Z20822 Contact with and (suspected) exposure to covid-19: Secondary | ICD-10-CM | POA: Diagnosis present

## 2021-10-20 DIAGNOSIS — R1011 Right upper quadrant pain: Secondary | ICD-10-CM | POA: Diagnosis present

## 2021-10-20 LAB — COMPREHENSIVE METABOLIC PANEL
ALT: 16 U/L (ref 0–44)
ALT: 13 U/L (ref 0–44)
AST: 18 U/L (ref 15–41)
AST: 21 U/L (ref 15–41)
Albumin: 2.7 g/dL — ABNORMAL LOW (ref 3.5–5.0)
Albumin: 2.5 g/dL — ABNORMAL LOW (ref 3.5–5.0)
Alkaline Phosphatase: 109 U/L (ref 38–126)
Alkaline Phosphatase: 94 U/L (ref 38–126)
Anion gap: 10 (ref 5–15)
Anion gap: 9 (ref 5–15)
BUN: 7 mg/dL (ref 6–20)
BUN: <5 mg/dL — ABNORMAL LOW (ref 6–20)
CO2: 21 mmol/L — ABNORMAL LOW (ref 22–32)
CO2: 21 mmol/L — ABNORMAL LOW (ref 22–32)
Calcium: 8.7 mg/dL — ABNORMAL LOW (ref 8.9–10.3)
Calcium: 8.8 mg/dL — ABNORMAL LOW (ref 8.9–10.3)
Chloride: 105 mmol/L (ref 98–111)
Chloride: 107 mmol/L (ref 98–111)
Creatinine, Ser: 0.59 mg/dL (ref 0.44–1.00)
Creatinine, Ser: 0.55 mg/dL (ref 0.44–1.00)
GFR, Estimated: >60 mL/min (ref >60)
GFR, Estimated: >60 mL/min (ref >60)
Glucose, Bld: 119 mg/dL — ABNORMAL HIGH (ref 70–99)
Glucose, Bld: 108 mg/dL — ABNORMAL HIGH (ref 70–99)
Potassium: 3.7 mmol/L (ref 3.5–5.1)
Potassium: 3.7 mmol/L (ref 3.5–5.1)
Sodium: 136 mmol/L (ref 135–145)
Sodium: 137 mmol/L (ref 135–145)
Total Bilirubin: 0.6 mg/dL (ref 0.3–1.2)
Total Bilirubin: 0.2 mg/dL — ABNORMAL LOW (ref 0.3–1.2)
Total Protein: 6 g/dL — ABNORMAL LOW (ref 6.5–8.1)
Total Protein: 5.5 g/dL — ABNORMAL LOW (ref 6.5–8.1)

## 2021-10-20 LAB — LIPASE, BLOOD
Lipase: 26 U/L (ref 11–51)
Lipase: 134 U/L — ABNORMAL HIGH (ref 11–51)

## 2021-10-20 LAB — URINALYSIS, ROUTINE W REFLEX MICROSCOPIC
Bilirubin Urine: NEGATIVE
Glucose, UA: NEGATIVE mg/dL
Hgb urine dipstick: NEGATIVE
Ketones, ur: 20 mg/dL — AB
Leukocytes,Ua: NEGATIVE
Nitrite: NEGATIVE
Protein, ur: 30 mg/dL — AB
Specific Gravity, Urine: 1.024 (ref 1.005–1.030)
pH: 5 (ref 5.0–8.0)

## 2021-10-20 LAB — CBC WITH DIFFERENTIAL/PLATELET
Abs Immature Granulocytes: 0.07 10*3/uL (ref 0.00–0.07)
Basophils Absolute: 0 10*3/uL (ref 0.0–0.1)
Basophils Relative: 0 %
Eosinophils Absolute: 0 10*3/uL (ref 0.0–0.5)
Eosinophils Relative: 0 %
HCT: 31.9 % — ABNORMAL LOW (ref 36.0–46.0)
Hemoglobin: 10.1 g/dL — ABNORMAL LOW (ref 12.0–15.0)
Immature Granulocytes: 1 %
Lymphocytes Relative: 15 %
Lymphs Abs: 1.9 10*3/uL (ref 0.7–4.0)
MCH: 27.3 pg (ref 26.0–34.0)
MCHC: 31.7 g/dL (ref 30.0–36.0)
MCV: 86.2 fL (ref 80.0–100.0)
Monocytes Absolute: 0.7 10*3/uL (ref 0.1–1.0)
Monocytes Relative: 5 %
Neutro Abs: 9.9 10*3/uL — ABNORMAL HIGH (ref 1.7–7.7)
Neutrophils Relative %: 79 %
Platelets: 232 10*3/uL (ref 150–400)
RBC: 3.7 MIL/uL — ABNORMAL LOW (ref 3.87–5.11)
RDW: 13.4 % (ref 11.5–15.5)
WBC: 12.5 10*3/uL — ABNORMAL HIGH (ref 4.0–10.5)
nRBC: 0 % (ref 0.0–0.2)

## 2021-10-20 LAB — CBC
HCT: 27.4 % — ABNORMAL LOW (ref 36.0–46.0)
Hemoglobin: 8.8 g/dL — ABNORMAL LOW (ref 12.0–15.0)
MCH: 27.3 pg (ref 26.0–34.0)
MCHC: 32.1 g/dL (ref 30.0–36.0)
MCV: 85.1 fL (ref 80.0–100.0)
Platelets: 207 10*3/uL (ref 150–400)
RBC: 3.22 MIL/uL — ABNORMAL LOW (ref 3.87–5.11)
RDW: 13.5 % (ref 11.5–15.5)
WBC: 10.6 10*3/uL — ABNORMAL HIGH (ref 4.0–10.5)
nRBC: 0 % (ref 0.0–0.2)

## 2021-10-20 LAB — RESP PANEL BY RT-PCR (FLU A&B, COVID) ARPGX2
Influenza A by PCR: NEGATIVE
Influenza B by PCR: NEGATIVE
SARS Coronavirus 2 by RT PCR: NEGATIVE

## 2021-10-20 LAB — AMYLASE
Amylase: 112 U/L — ABNORMAL HIGH (ref 28–100)
Amylase: 299 U/L — ABNORMAL HIGH (ref 28–100)

## 2021-10-20 LAB — CULTURE, BETA STREP (GROUP B ONLY): Strep Gp B Culture: NEGATIVE

## 2021-10-20 MED ORDER — PRENATAL MULTIVITAMIN CH: 1.0000 | ORAL_TABLET | Freq: Every day | ORAL | Status: DC

## 2021-10-20 MED ORDER — MORPHINE SULFATE (PF) 4 MG/ML IV SOLN
4.0000 mg | Freq: Once | INTRAVENOUS | Status: AC
  Administered 2021-10-20: 4 mg via INTRAVENOUS
  Filled 2021-10-20: qty 1

## 2021-10-20 MED ORDER — DOCUSATE SODIUM 100 MG PO CAPS
100.0000 mg | ORAL_CAPSULE | Freq: Every day | ORAL | Status: DC
  Administered 2021-10-20 – 2021-10-22 (×3): 100 mg via ORAL
  Filled 2021-10-20 (×3): qty 1

## 2021-10-20 MED ORDER — ZOLPIDEM TARTRATE 5 MG PO TABS: 5.0000 mg | ORAL_TABLET | Freq: Every evening | ORAL | Status: DC | PRN

## 2021-10-20 MED ORDER — ACETAMINOPHEN 500 MG PO TABS
1000.0000 mg | ORAL_TABLET | Freq: Once | ORAL | Status: DC
  Filled 2021-10-20: qty 2

## 2021-10-20 MED ORDER — HYDROMORPHONE HCL 1 MG/ML IJ SOLN
1.0000 mg | INTRAMUSCULAR | Status: DC | PRN
  Administered 2021-10-20 – 2021-10-22 (×9): 1 mg via INTRAVENOUS
  Filled 2021-10-20 (×9): qty 1

## 2021-10-20 MED ORDER — CALCIUM CARBONATE ANTACID 500 MG PO CHEW: 2.0000 | CHEWABLE_TABLET | ORAL | Status: DC | PRN

## 2021-10-20 MED ORDER — ONDANSETRON HCL 4 MG/2ML IJ SOLN
4.0000 mg | Freq: Once | INTRAMUSCULAR | Status: AC
  Administered 2021-10-20: 4 mg via INTRAVENOUS
  Filled 2021-10-20: qty 2

## 2021-10-20 MED ORDER — LACTATED RINGERS IV BOLUS
1000.0000 mL | Freq: Once | INTRAVENOUS | Status: AC
  Administered 2021-10-20: 1000 mL via INTRAVENOUS

## 2021-10-20 MED ORDER — LACTATED RINGERS IV SOLN: INTRAVENOUS | Status: DC

## 2021-10-20 MED ORDER — CYCLOBENZAPRINE HCL 5 MG PO TABS
10.0000 mg | ORAL_TABLET | Freq: Once | ORAL | Status: AC
  Administered 2021-10-20: 10 mg via ORAL
  Filled 2021-10-20: qty 2

## 2021-10-20 MED ORDER — SODIUM CHLORIDE 0.9 % IV SOLN
25.0000 mg | Freq: Once | INTRAVENOUS | Status: AC
  Administered 2021-10-20: 25 mg via INTRAVENOUS
  Filled 2021-10-20: qty 1

## 2021-10-20 MED ORDER — ACETAMINOPHEN 325 MG PO TABS
650.0000 mg | ORAL_TABLET | ORAL | Status: DC | PRN
  Administered 2021-10-20 – 2021-10-22 (×4): 650 mg via ORAL
  Filled 2021-10-20 (×4): qty 2

## 2021-10-20 MED ORDER — HYDROMORPHONE HCL 1 MG/ML IJ SOLN
1.0000 mg | Freq: Once | INTRAMUSCULAR | Status: AC
  Administered 2021-10-20: 1 mg via INTRAVENOUS
  Filled 2021-10-20: qty 1

## 2021-10-20 NOTE — H&P (Signed)
Jill [name] is a 25 y.o. female G1P0000 at  110w0d presenting for severe RUQ abdominal pain with elevated amylase/lipase c/w pancreatitis.   [facility]  RESULTS  Language English Pap 04/20/20 neg  Initiated care at 13wks GC/CT Initial:   -/-         36wks:  Dating by LMP c/w 9wk U/S    Support person  Genetics NT/IT: neg    Panorama: low-risk female  BP cuff office Carrier Screen neg    [place]/Hgb Elec neg  Rhogam n/a    TDaP vaccine 08/21/21  Blood Type O/Positive/-- (06/23 1443)  Flu vaccine 07/20/21  Antibody Negative (06/23 1443)  Covid vaccine  HBsAg Negative (06/23 1443)    RPR Non Reactive (06/23 1443)  Anatomy US Female '[place]' bilateral [place] Rubella  <0.90 (06/23 1443)  Feeding Plan breast HIV Non Reactive (06/23 1443)  Contraception LARC Hep C neg  Circumcision n/a    Pediatrician Probably [place] A1C/GTT Early:      26-28wks: normal  Prenatal Classes discussed      GBS     [ ]  PCN allergy  BTL Consent n/a    VBAC Consent  PHQ9 & GAD7  [ ] New OB  [ ] 28wks   [ ] 36wks  Waterbirth [ ] Class [ ]  36wkCNM visit/consent      OB History     Gravida  1   Para  0   Term  0   Preterm  0   AB  0   Living  0      SAB  0   IAB  0   Ectopic  0   Multiple  0   Live Births  0          Past Medical History:  Diagnosis Date   Medical history non-contributory    Past Surgical History:  Procedure Laterality Date   WISDOM TOOTH EXTRACTION Bilateral    Family History: family history includes Alcohol abuse in her maternal grandfather; Cancer in her maternal grandfather and paternal grandmother; Cerebral aneurysm in her paternal grandfather; Diverticulitis in her maternal grandfather and mother; Gallbladder disease in her father and sister; Hypertension in her paternal grandfather; Von Willebrand disease in her sister. Social History:  reports that she has never smoked. She has never used smokeless tobacco. She reports that she does not currently use alcohol. She reports  that she does not use drugs.     Maternal Diabetes: No Genetic Screening: Normal Maternal Ultrasounds/Referrals: Normal Fetal Ultrasounds or other Referrals:  None Maternal Substance Abuse:  No Significant Maternal Medications:  None Significant Maternal Lab Results:  Group B Strep negative Other Comments:  None  Review of Systems  Constitutional:  Negative for chills, fatigue and fever.  Eyes:  Negative for visual disturbance.  Respiratory:  Negative for shortness of breath.   Cardiovascular:  Negative for chest pain.  Gastrointestinal:  Positive for abdominal pain, nausea and vomiting.  Genitourinary:  Negative for difficulty urinating, dysuria, flank pain, pelvic pain, vaginal bleeding, vaginal discharge and vaginal pain.  Neurological:  Negative for dizziness and headaches.  Psychiatric/Behavioral: Negative.    Maternal Medical History:  Reason for admission: Nausea.  RUQ abdominal pain that radiates to right upper back and n/v  Contractions: Frequency: irregular.   Perceived severity is mild.   Fetal activity: Perceived fetal activity is normal.   Prenatal complications: no prenatal complications Prenatal Complications - Diabetes: none.    Blood pressure 121/60, pulse 70, temperature 98 F (36.7 C),  temperature source Oral, resp. rate 20, height 5\' 2"  (1.575 m), weight 90.7 kg, last menstrual period 02/03/2021, SpO2 98 %. Maternal Exam:  Uterine Assessment: Contraction strength is mild.  Contraction frequency is irregular.    Fetal Exam Fetal Monitor Review: Mode: ultrasound.   Baseline rate: 135.  Variability: moderate (6-25 bpm).   Pattern: accelerations present and no decelerations.   Fetal State Assessment: Category I - tracings are normal.  Physical Exam Vitals and nursing note reviewed.  Constitutional:      Appearance: She is well-developed.  Cardiovascular:     Rate and Rhythm: Normal rate.     Heart sounds: Normal heart sounds.  Pulmonary:      Effort: Pulmonary effort is normal.     Breath sounds: Normal breath sounds.  Abdominal:     Palpations: Abdomen is soft.  Musculoskeletal:        General: Normal range of motion.     Cervical back: Normal range of motion.  Skin:    General: Skin is warm and dry.  Neurological:     Mental Status: She is alert and oriented to person, place, and time.  Psychiatric:        Behavior: Behavior normal.        Thought Content: Thought content normal.        Judgment: Judgment normal.    Prenatal labs: ABO, Rh: O/Positive/-- (06/23 1443) Antibody: Negative (10/04 0828) Rubella: <0.90 (06/23 1443) RPR: Non Reactive (10/04 0828)  HBsAg: Negative (06/23 1443)  HIV: Non Reactive (10/04 0828)  GBS: Negative/-- (11/29 1400)   Assessment/Plan: Consult Dr 02-07-1975 with assessment and findings Admit to HROB Unit for IV fluids and pain management NST Q shift   Macon Large 10/20/2021, 4:45 PM

## 2021-10-20 NOTE — MAU Note (Signed)
Presents with pain in RUQ that intermittently radiates in her back.  Denies LOF or VB.  Reports +FM. Pt was discharged this morning with suspected gallbladder spasms.

## 2021-10-20 NOTE — MAU Provider Note (Addendum)
History     CSN: [id number]  Arrival date and time: 10/19/21 2318   Event Date/Time   First Provider Initiated Contact with Patient 10/20/21 0031      Chief Complaint  Patient presents with   Abdominal Pain   Jill [name] is a 25 y.o. G1P0 at [redacted]w[redacted]d who receives care at [facility].  She presents today for Abdominal Pain.  She states the pain started about 2.5 hours ago while laying in bed.  Patient reports the pain feels like stabbing and gas pain that is constant.  She reports she is also having some vomiting that started about 30 minutes after pain onset and when this occurs the pain improves.  The patient denies aggravating or relieving symptoms.  She endorses fetal movement and denies vaginal bleeding or discharge.  She further denies contractions. She denies history of gallstones or gallbladder issues. She rates the pain a 8/10 currently.   1430: [place] [employer]   OB History     Gravida  1   Para  0   Term  0   Preterm  0   AB  0   Living  0      SAB  0   IAB  0   Ectopic  0   Multiple  0   Live Births  0           Past Medical History:  Diagnosis Date   Medical history non-contributory     Past Surgical History:  Procedure Laterality Date   WISDOM TOOTH EXTRACTION Bilateral     Family History  Problem Relation Age of Onset   Diverticulitis Mother    Gallbladder disease Father    Gallbladder disease Sister    Von Willebrand disease Sister    Alcohol abuse Maternal Grandfather    Diverticulitis Maternal Grandfather    Cancer Maternal Grandfather        lung   Cancer Paternal Grandmother    Hypertension Paternal Grandfather    Cerebral aneurysm Paternal Grandfather     Social History   Tobacco Use   Smoking status: Never   Smokeless tobacco: Never  Vaping Use   Vaping Use: Never used  Substance Use Topics   Alcohol use: Not Currently    Comment: occasional   Drug use: Never    Allergies: No Known Allergies  Medications  Prior to Admission  Medication Sig Dispense Refill Last Dose   Pediatric Multivitamins-Iron (FLINTSTONES COMPLETE) 10 MG CHEW Chew 2 tablets by mouth daily.   10/18/2021   acetaminophen (TYLENOL 8 HOUR) 650 MG CR tablet Take 1 tablet (650 mg total) by mouth every 6 (six) hours as needed for pain.      Doxylamine-Pyridoxine (DICLEGIS) 10-10 MG TBEC 2 tabs q hs, if sx persist add 1 tab q am on day 3, if sx persist add 1 tab q afternoon on day 4 100 tablet 6    ondansetron (ZOFRAN-ODT) 4 MG disintegrating tablet Take 4 mg by mouth every 8 (eight) hours as needed.       Review of Systems  Gastrointestinal:  Positive for abdominal pain, nausea and vomiting.  Genitourinary:  Negative for difficulty urinating, dysuria, vaginal bleeding and vaginal discharge.  Neurological:  Negative for dizziness, light-headedness and headaches.  Physical Exam   Blood pressure (!) 108/58, pulse 98, temperature 97.9 F (36.6 C), temperature source Oral, resp. rate 18, height 5\' 2"  (1.575 m), weight 90.3 kg, last menstrual period 02/03/2021, SpO2 100 %.  Physical Exam  Vitals reviewed.  Constitutional:      General: She is in acute distress.     Appearance: She is well-developed.  HENT:     Head: Normocephalic and atraumatic.  Eyes:     Conjunctiva/sclera: Conjunctivae normal.  Cardiovascular:     Rate and Rhythm: Regular rhythm.     Heart sounds: Normal heart sounds.  Pulmonary:     Effort: Pulmonary effort is normal. No respiratory distress.     Breath sounds: Normal breath sounds.  Abdominal:     General: Bowel sounds are normal. There is no distension.     Palpations: Abdomen is soft.     Tenderness: There is abdominal tenderness in the right upper quadrant. There is no rebound.       Comments: Gravid, Appears AGA  Musculoskeletal:     Cervical back: Normal range of motion.  Skin:    General: Skin is warm and dry.  Neurological:     Mental Status: She is alert and oriented to person, place, and  time.    Fetal Assessment 145 bpm, Mod Var, -Decels, +Accels Toco: No ctx graphed  MAU Course   Results for orders placed or performed during the hospital encounter of 10/19/21 (from the past 24 hour(s))  Urinalysis, Routine w reflex microscopic Urine, Clean Catch     Status: Abnormal   Collection Time: 10/20/21  1:11 AM  Result Value Ref Range   Color, Urine YELLOW YELLOW   APPearance CLOUDY (A) CLEAR   Specific Gravity, Urine 1.024 1.005 - 1.030   pH 5.0 5.0 - 8.0   Glucose, UA NEGATIVE NEGATIVE mg/dL   Hgb urine dipstick NEGATIVE NEGATIVE   Bilirubin Urine NEGATIVE NEGATIVE   Ketones, ur 20 (A) NEGATIVE mg/dL   Protein, ur 30 (A) NEGATIVE mg/dL   Nitrite NEGATIVE NEGATIVE   Leukocytes,Ua NEGATIVE NEGATIVE   WBC, UA 0-5 0 - 5 WBC/hpf   Bacteria, UA RARE (A) NONE SEEN   Squamous Epithelial / LPF 0-5 0 - 5   Mucus PRESENT   CBC with Differential/Platelet     Status: Abnormal   Collection Time: 10/20/21  1:11 AM  Result Value Ref Range   WBC 12.5 (H) 4.0 - 10.5 K/uL   RBC 3.70 (L) 3.87 - 5.11 MIL/uL   Hemoglobin 10.1 (L) 12.0 - 15.0 g/dL   HCT 93.7 (L) 16.9 - 67.8 %   MCV 86.2 80.0 - 100.0 fL   MCH 27.3 26.0 - 34.0 pg   MCHC 31.7 30.0 - 36.0 g/dL   RDW 93.8 10.1 - 75.1 %   Platelets 232 150 - 400 K/uL   nRBC 0.0 0.0 - 0.2 %   Neutrophils Relative % 79 %   Neutro Abs 9.9 (H) 1.7 - 7.7 K/uL   Lymphocytes Relative 15 %   Lymphs Abs 1.9 0.7 - 4.0 K/uL   Monocytes Relative 5 %   Monocytes Absolute 0.7 0.1 - 1.0 K/uL   Eosinophils Relative 0 %   Eosinophils Absolute 0.0 0.0 - 0.5 K/uL   Basophils Relative 0 %   Basophils Absolute 0.0 0.0 - 0.1 K/uL   Immature Granulocytes 1 %   Abs Immature Granulocytes 0.07 0.00 - 0.07 K/uL  Comprehensive metabolic panel     Status: Abnormal   Collection Time: 10/20/21  1:11 AM  Result Value Ref Range   Sodium 136 135 - 145 mmol/L   Potassium 3.7 3.5 - 5.1 mmol/L   Chloride 105 98 - 111 mmol/L   CO2 21 (  L) 22 - 32 mmol/L    Glucose, Bld 119 (H) 70 - 99 mg/dL   BUN 7 6 - 20 mg/dL   Creatinine, Ser 6.15 0.44 - 1.00 mg/dL   Calcium 8.7 (L) 8.9 - 10.3 mg/dL   Total Protein 6.0 (L) 6.5 - 8.1 g/dL   Albumin 2.7 (L) 3.5 - 5.0 g/dL   AST 18 15 - 41 U/L   ALT 16 0 - 44 U/L   Alkaline Phosphatase 109 38 - 126 U/L   Total Bilirubin 0.6 0.3 - 1.2 mg/dL   GFR, Estimated >37 >94 mL/min   Anion gap 10 5 - 15  Lipase, blood     Status: None   Collection Time: 10/20/21  1:11 AM  Result Value Ref Range   Lipase 26 11 - 51 U/L  Amylase     Status: Abnormal   Collection Time: 10/20/21  1:11 AM  Result Value Ref Range   Amylase 112 (H) 28 - 100 U/L   US Abdomen Limited RUQ (LIVER/GB)  Result Date: 10/20/2021 CLINICAL DATA:  Right upper quadrant abdominal pain EXAM: ULTRASOUND ABDOMEN LIMITED RIGHT UPPER QUADRANT COMPARISON:  None. FINDINGS: Gallbladder: No gallstones or wall thickening visualized. No sonographic Murphy sign noted by sonographer. Common bile duct: Diameter: 2 mm Liver: No focal lesion identified. Within normal limits in parenchymal echogenicity. Portal vein is patent on color Doppler imaging with normal direction of blood flow towards the liver. Other: None. IMPRESSION: Negative right upper quadrant ultrasound. Electronically Signed   By: Charline Bills M.D.   On: 10/20/2021 02:23    MDM PE Labs:CBC/D, CMP, Amylase, Lipase EFM Pain medication AntiEmetic Ultrasund Assessment and Plan  25 year old G1P0  SIUP at 62 weeks Cat I FT RUQ Pain Suspect Cholecystitis   -Exam performed and findings discussed. -Patient offered pain medication and accepts. -Will give morphine 4mg  IV now. -Will send for abdominal and await results. -Labs ordered -NST Reactive  [place] MSN, CNM 10/20/2021, 12:32 AM   Reassessment (3:01 AM)  -Results return as above. -Patient reports pain initially improved with morphine dosing, but now worsening. -Dr. 14/01/2021 consulted and informed of patient status,  evaluation, interventions, and results. Advised: *Amylase notable -Patient likely having gallbladder spasm. -Given phenergan 25mg  IV -Patient updated on POC and without questions.   Reassessment (4:11 AM)  -Nurse reports patient with continued c/o worsening pain that has now radiated to her back. -Negative CVAT per nurse -Phenergan bolus with Otelia Limes completed.  -Will give flexeril and reassess.   Reassessment (5:21 AM)  -Patient s/pt flexeril and reports pain remains in upper shoulder/back area. -No pain in previous RUQ area.  -Informed that this area is all musculoskeletal with no major organs in area. -Will give tylenol and additional dose of morphine. -Instructed to follow up with primary office as scheduled. -Precautions Given. -Return to MAU for worsening or onset of new symptoms. -Discharge to home in stable condition.   MSN, CNM Advanced Practice Provider, Center for [facility]  Addendum 5:45 AM -Nurse reports patient with vomiting -Will discontinue tylenol dose. -Proceed with morphine in addition to Zofran. -Place on monitor for NST.  Reassessment (6:26 AM)  -Patient resting in bed. -Reports relief with morphine dosing, nausea has subsided. -NST Reactive; 150 bpm, Mod Var, -Decels, +15x15 Accels, Mild uterine irritability noted. -Will discharge to home. -Given return precautions.  Cherre Robins MSN, CNM Advanced Practice Provider, Center for [facility]

## 2021-10-21 DIAGNOSIS — O99013 Anemia complicating pregnancy, third trimester: Secondary | ICD-10-CM | POA: Diagnosis present

## 2021-10-21 DIAGNOSIS — Z3A37 37 weeks gestation of pregnancy: Secondary | ICD-10-CM

## 2021-10-21 DIAGNOSIS — K858 Other acute pancreatitis without necrosis or infection: Secondary | ICD-10-CM

## 2021-10-21 LAB — CBC WITH DIFFERENTIAL/PLATELET
Abs Immature Granulocytes: 0.03 10*3/uL (ref 0.00–0.07)
Basophils Absolute: 0 10*3/uL (ref 0.0–0.1)
Basophils Relative: 0 %
Eosinophils Absolute: 0 10*3/uL (ref 0.0–0.5)
Eosinophils Relative: 0 %
HCT: 26.8 % — ABNORMAL LOW (ref 36.0–46.0)
Hemoglobin: 8.3 g/dL — ABNORMAL LOW (ref 12.0–15.0)
Immature Granulocytes: 0 %
Lymphocytes Relative: 19 %
Lymphs Abs: 1.7 10*3/uL (ref 0.7–4.0)
MCH: 26.8 pg (ref 26.0–34.0)
MCHC: 31 g/dL (ref 30.0–36.0)
MCV: 86.5 fL (ref 80.0–100.0)
Monocytes Absolute: 0.6 10*3/uL (ref 0.1–1.0)
Monocytes Relative: 6 %
Neutro Abs: 6.7 10*3/uL (ref 1.7–7.7)
Neutrophils Relative %: 75 %
Platelets: 187 10*3/uL (ref 150–400)
RBC: 3.1 MIL/uL — ABNORMAL LOW (ref 3.87–5.11)
RDW: 14 % (ref 11.5–15.5)
WBC: 9 10*3/uL (ref 4.0–10.5)
nRBC: 0 % (ref 0.0–0.2)

## 2021-10-21 LAB — TYPE AND SCREEN
ABO/RH(D): O POS
Antibody Screen: NEGATIVE

## 2021-10-21 LAB — COMPREHENSIVE METABOLIC PANEL
ALT: 13 U/L (ref 0–44)
AST: 17 U/L (ref 15–41)
Albumin: 2.4 g/dL — ABNORMAL LOW (ref 3.5–5.0)
Alkaline Phosphatase: 96 U/L (ref 38–126)
Anion gap: 8 (ref 5–15)
BUN: <5 mg/dL — ABNORMAL LOW (ref 6–20)
CO2: 23 mmol/L (ref 22–32)
Calcium: 8.3 mg/dL — ABNORMAL LOW (ref 8.9–10.3)
Chloride: 103 mmol/L (ref 98–111)
Creatinine, Ser: 0.53 mg/dL (ref 0.44–1.00)
GFR, Estimated: >60 mL/min (ref >60)
Glucose, Bld: 86 mg/dL (ref 70–99)
Potassium: 3.4 mmol/L — ABNORMAL LOW (ref 3.5–5.1)
Sodium: 134 mmol/L — ABNORMAL LOW (ref 135–145)
Total Bilirubin: 0.6 mg/dL (ref 0.3–1.2)
Total Protein: 5.3 g/dL — ABNORMAL LOW (ref 6.5–8.1)

## 2021-10-21 LAB — AMYLASE: Amylase: 151 U/L — ABNORMAL HIGH (ref 28–100)

## 2021-10-21 LAB — CULTURE, OB URINE

## 2021-10-21 LAB — LIPASE, BLOOD: Lipase: 33 U/L (ref 11–51)

## 2021-10-21 MED ORDER — ONDANSETRON HCL 4 MG/2ML IJ SOLN
4.0000 mg | Freq: Four times a day (QID) | INTRAMUSCULAR | Status: DC | PRN
  Administered 2021-10-21 (×2): 4 mg via INTRAVENOUS
  Filled 2021-10-21 (×2): qty 2

## 2021-10-21 MED ORDER — SODIUM CHLORIDE 0.9 % IV SOLN
500.0000 mg | Freq: Once | INTRAVENOUS | Status: AC
  Administered 2021-10-21: 500 mg via INTRAVENOUS
  Filled 2021-10-21: qty 25

## 2021-10-21 MED ORDER — CYCLOBENZAPRINE HCL 10 MG PO TABS
10.0000 mg | ORAL_TABLET | Freq: Three times a day (TID) | ORAL | Status: DC | PRN
  Administered 2021-10-21 – 2021-10-22 (×3): 10 mg via ORAL
  Filled 2021-10-21 (×3): qty 1

## 2021-10-21 NOTE — Progress Notes (Signed)
FACULTY PRACTICE ANTEPARTUM COMPREHENSIVE PROGRESS NOTE  Jill [name] is a 25 y.o. G1P0000 at [redacted]w[redacted]d who is admitted for acute pancreatitis.  Estimated Date of Delivery: 11/10/21 Fetal presentation is cephalic.  Length of Stay:  1 Days. Admitted 10/20/2021  Subjective: Patient with continued RUQ and upper back pain this morning. Reports some pelvic pressure too.  Patient reports good fetal movement.  She reports no uterine contractions, no bleeding and no loss of fluid per vagina.  Vitals:  Blood pressure 124/75, pulse 67, temperature 98.3 F (36.8 C), temperature source Oral, resp. rate 18, height 5\' 2"  (1.575 m), weight 90.7 kg, last menstrual period 02/03/2021, SpO2 100 %. Physical Examination: CONSTITUTIONAL: Well-developed, well-nourished female in no some distress due to pain  NEUROLOGIC: Alert and oriented to person, place, and time. No cranial nerve deficit noted. PSYCHIATRIC: Normal mood and affect. Normal behavior. Normal judgment and thought content. CARDIOVASCULAR: Normal heart rate noted, regular rhythm RESPIRATORY: Effort and breath sounds normal, no problems with respiration noted MUSCULOSKELETAL: Normal range of motion. No edema and no tenderness. 2+ distal pulses. ABDOMEN: Soft, moderately tender in RUQ, nondistended, gravid. CERVIX: Dilation: Closed Effacement (%): Thick Cervical Position: Posterior Station: Ballotable Presentation: Vertex Exam by:: Dr [email or web address]  Fetal monitoring: FHR: 155 bpm, Variability: moderate, Accelerations: Present, Decelerations: Absent  Uterine activity: contractions 1-4 per minute  Results for orders placed or performed during the hospital encounter of 10/20/21 (from the past 48 hour(s))  CBC     Status: Abnormal   Collection Time: 10/20/21  2:14 PM  Result Value Ref Range   WBC 10.6 (H) 4.0 - 10.5 K/uL   RBC 3.22 (L) 3.87 - 5.11 MIL/uL   Hemoglobin 8.8 (L) 12.0 - 15.0 g/dL   HCT 14/03/22 (L) 12.8 - 78.6 %   MCV 85.1 80.0 - 100.0 fL    MCH 27.3 26.0 - 34.0 pg   MCHC 32.1 30.0 - 36.0 g/dL   RDW 76.7 20.9 - 47.0 %   Platelets 207 150 - 400 K/uL   nRBC 0.0 0.0 - 0.2 %    Comment: Performed at [facility], [address], [place], [place]  Comprehensive metabolic panel     Status: Abnormal   Collection Time: 10/20/21  2:14 PM  Result Value Ref Range   Sodium 137 135 - 145 mmol/L   Potassium 3.7 3.5 - 5.1 mmol/L   Chloride 107 98 - 111 mmol/L   CO2 21 (L) 22 - 32 mmol/L   Glucose, Bld 108 (H) 70 - 99 mg/dL    Comment: Glucose reference range applies only to samples taken after fasting for at least 8 hours.   BUN <5 (L) 6 - 20 mg/dL   Creatinine, Ser 14/03/22 0.44 - 1.00 mg/dL   Calcium 8.8 (L) 8.9 - 10.3 mg/dL   Total Protein 5.5 (L) 6.5 - 8.1 g/dL   Albumin 2.5 (L) 3.5 - 5.0 g/dL   AST 21 15 - 41 U/L   ALT 13 0 - 44 U/L   Alkaline Phosphatase 94 38 - 126 U/L   Total Bilirubin 0.2 (L) 0.3 - 1.2 mg/dL   GFR, Estimated 9.47 >65 mL/min    Comment: (NOTE) Calculated using the CKD-EPI Creatinine Equation (2021)    Anion gap 9 5 - 15    Comment: Performed at [facility], [address], [place], [place]  Amylase     Status: Abnormal   Collection Time: 10/20/21  2:14 PM  Result Value  Ref Range   Amylase 299 (H) 28 - 100 U/L    Comment: Performed at [facility], [address], [place], [place] [address]  Lipase, blood     Status: Abnormal   Collection Time: 10/20/21  2:14 PM  Result Value Ref Range   Lipase 134 (H) 11 - 51 U/L    Comment: Performed at [facility], [address], [place], [place] [address]  Resp Panel by RT-PCR (Flu A&B, Covid) Nasopharyngeal Swab     Status: None   Collection Time: 10/20/21  4:45 PM   Specimen: Nasopharyngeal Swab; Nasopharyngeal(NP) swabs in vial transport medium  Result Value Ref Range   SARS Coronavirus 2 by RT PCR NEGATIVE NEGATIVE    Comment: (NOTE) SARS-CoV-2 target nucleic acids are NOT DETECTED.  The SARS-CoV-2 RNA is generally  detectable in upper respiratory specimens during the acute phase of infection. The lowest concentration of SARS-CoV-2 viral copies this assay can detect is 138 copies/mL. A negative result does not preclude SARS-Cov-2 infection and should not be used as the sole basis for treatment or other patient management decisions. A negative result may occur with  improper specimen collection/handling, submission of specimen other than nasopharyngeal swab, presence of viral mutation(s) within the areas targeted by this assay, and inadequate number of viral copies(<138 copies/mL). A negative result must be combined with clinical observations, patient history, and epidemiological information. The expected result is Negative.  Fact Sheet for Patients:  BloggerCourse.com  Fact Sheet for Healthcare Providers:  SeriousBroker.it  This test is no t yet approved or cleared by the [place] FDA and  has been authorized for detection and/or diagnosis of SARS-CoV-2 by FDA under an Emergency Use Authorization (EUA). This EUA will remain  in effect (meaning this test can be used) for the duration of the COVID-19 declaration under Section 564(b)(1) of the Act, 21 U.S.C.section 360bbb-3(b)(1), unless the authorization is terminated  or revoked sooner.       Influenza A by PCR NEGATIVE NEGATIVE   Influenza B by PCR NEGATIVE NEGATIVE    Comment: (NOTE) The Xpert Xpress SARS-CoV-2/FLU/RSV plus assay is intended as an aid in the diagnosis of influenza from Nasopharyngeal swab specimens and should not be used as a sole basis for treatment. Nasal washings and aspirates are unacceptable for Xpert Xpress SARS-CoV-2/FLU/RSV testing.  Fact Sheet for Patients: BloggerCourse.com  Fact Sheet for Healthcare Providers: SeriousBroker.it  This test is not yet approved or cleared by the [place] FDA and has  been authorized for detection and/or diagnosis of SARS-CoV-2 by FDA under an Emergency Use Authorization (EUA). This EUA will remain in effect (meaning this test can be used) for the duration of the COVID-19 declaration under Section 564(b)(1) of the Act, 21 U.S.C. section 360bbb-3(b)(1), unless the authorization is terminated or revoked.  Performed at [facility], [address] [place], [place] [address]   CBC with Differential/Platelet     Status: Abnormal   Collection Time: 10/21/21  3:51 AM  Result Value Ref Range   WBC 9.0 4.0 - 10.5 K/uL   RBC 3.10 (L) 3.87 - 5.11 MIL/uL   Hemoglobin 8.3 (L) 12.0 - 15.0 g/dL   HCT 06.2 (L) 69.4 - 85.4 %   MCV 86.5 80.0 - 100.0 fL   MCH 26.8 26.0 - 34.0 pg   MCHC 31.0 30.0 - 36.0 g/dL   RDW 62.7 03.5 - 00.9 %   Platelets 187 150 - 400 K/uL   nRBC  0.0 0.0 - 0.2 %   Neutrophils Relative % 75 %   Neutro Abs 6.7 1.7 - 7.7 K/uL   Lymphocytes Relative 19 %   Lymphs Abs 1.7 0.7 - 4.0 K/uL   Monocytes Relative 6 %   Monocytes Absolute 0.6 0.1 - 1.0 K/uL   Eosinophils Relative 0 %   Eosinophils Absolute 0.0 0.0 - 0.5 K/uL   Basophils Relative 0 %   Basophils Absolute 0.0 0.0 - 0.1 K/uL   Immature Granulocytes 0 %   Abs Immature Granulocytes 0.03 0.00 - 0.07 K/uL    Comment: Performed at [facility], [address], [place], [place] [address]  Lipase, blood     Status: None   Collection Time: 10/21/21  3:51 AM  Result Value Ref Range   Lipase 33 11 - 51 U/L    Comment: Performed at [facility], [address] [place], [place] [address]  Amylase     Status: Abnormal   Collection Time: 10/21/21  3:51 AM  Result Value Ref Range   Amylase 151 (H) 28 - 100 U/L    Comment: Performed at [facility], [address] [place], [place] [address]  Comprehensive metabolic panel     Status: Abnormal   Collection Time: 10/21/21  3:51 AM  Result Value Ref Range   Sodium 134 (L) 135 - 145 mmol/L   Potassium 3.4 (L) 3.5 - 5.1  mmol/L   Chloride 103 98 - 111 mmol/L   CO2 23 22 - 32 mmol/L   Glucose, Bld 86 70 - 99 mg/dL    Comment: Glucose reference range applies only to samples taken after fasting for at least 8 hours.   BUN <5 (L) 6 - 20 mg/dL   Creatinine, Ser 6.21 0.44 - 1.00 mg/dL   Calcium 8.3 (L) 8.9 - 10.3 mg/dL   Total Protein 5.3 (L) 6.5 - 8.1 g/dL   Albumin 2.4 (L) 3.5 - 5.0 g/dL   AST 17 15 - 41 U/L   ALT 13 0 - 44 U/L   Alkaline Phosphatase 96 38 - 126 U/L   Total Bilirubin 0.6 0.3 - 1.2 mg/dL   GFR, Estimated >30 >86 mL/min    Comment: (NOTE) Calculated using the CKD-EPI Creatinine Equation (2021)    Anion gap 8 5 - 15    Comment: Performed at [facility], [address] [place], [place] [address]  Type and screen     Status: None   Collection Time: 10/21/21  3:51 AM  Result Value Ref Range   ABO/RH(D) O POS    Antibody Screen NEG    Sample Expiration      10/24/2021,2359 Performed at [facility], [address], [place], [place] [address]     US Abdomen Limited RUQ (LIVER/GB)  Result Date: 10/20/2021 CLINICAL DATA:  Right upper quadrant abdominal pain EXAM: ULTRASOUND ABDOMEN LIMITED RIGHT UPPER QUADRANT COMPARISON:  None. FINDINGS: Gallbladder: No gallstones or wall thickening visualized. No sonographic Murphy sign noted by sonographer. Common bile duct: Diameter: 2 mm Liver: No focal lesion identified. Within normal limits in parenchymal echogenicity. Portal vein is patent on color Doppler imaging with normal direction of blood flow towards the liver. Other: None. IMPRESSION: Negative right upper quadrant ultrasound. Electronically Signed   By: Charline Bills M.D.   On: 10/20/2021 02:23    Current scheduled medications  docusate sodium  100 mg Oral Daily   prenatal multivitamin  1 tablet  Oral Q1200    I have reviewed the patient's current medications.  ASSESSMENT: Principal Problem:   Acute pancreatitis Active Problems:   [redacted] weeks gestation of pregnancy    Anemia in pregnancy, third trimester   PLAN: Continue NPO, IV hydration, Dilaudid prn Hold on re-feeding now, may need to consult GI about when to do this based on her symptoms. Reassured by lipase normalizing this morning. No signs/symptoms of labor or other maternal-fetal distress, Category 1 FHR tracing. Counseled about treatment of asymptomatic anemia, Venofer recommended. She agreed to this and this was ordered. Continue routine antenatal care.   Jaynie Collins, MD, FACOG Obstetrician & Gynecologist, [facility] for [employer], [facility]

## 2021-10-21 NOTE — Progress Notes (Signed)
FACULTY PRACTICE ANTEPARTUM COMPREHENSIVE PROGRESS NOTE  Jill [name] is a 25 y.o. G1P0000 at [redacted]w[redacted]d who is admitted for acute pancreatitis.  Estimated Date of Delivery: 11/10/21 Fetal presentation is cephalic.  Length of Stay:  1 Days. Admitted 10/20/2021  Subjective: Patient with continued RUQ and upper back pain this morning. Reports some pelvic pressure too.  Patient reports good fetal movement.  She reports no uterine contractions, no bleeding and no loss of fluid per vagina.  Vitals:  Blood pressure 124/75, pulse 67, temperature 98.3 F (36.8 C), temperature source Oral, resp. rate 18, height 5\' 2"  (1.575 m), weight 90.7 kg, last menstrual period 02/03/2021, SpO2 100 %. Physical Examination: CONSTITUTIONAL: Well-developed, well-nourished female in no some distress due to pain  NEUROLOGIC: Alert and oriented to person, place, and time. No cranial nerve deficit noted. PSYCHIATRIC: Normal mood and affect. Normal behavior. Normal judgment and thought content. CARDIOVASCULAR: Normal heart rate noted, regular rhythm RESPIRATORY: Effort and breath sounds normal, no problems with respiration noted MUSCULOSKELETAL: Normal range of motion. No edema and no tenderness. 2+ distal pulses. ABDOMEN: Soft, moderately tender in RUQ, nondistended, gravid. CERVIX: Dilation: Closed Effacement (%): Thick Cervical Position: Posterior Station: Ballotable Presentation: Vertex Exam by:: Dr [email or web address]  Fetal monitoring: FHR: 155 bpm, Variability: moderate, Accelerations: Present, Decelerations: Absent  Uterine activity: contractions 1-4 per minute  Results for orders placed or performed during the hospital encounter of 10/20/21 (from the past 48 hour(s))  CBC     Status: Abnormal   Collection Time: 10/20/21  2:14 PM  Result Value Ref Range   WBC 10.6 (H) 4.0 - 10.5 K/uL   RBC 3.22 (L) 3.87 - 5.11 MIL/uL   Hemoglobin 8.8 (L) 12.0 - 15.0 g/dL   HCT 14/03/22 (L) 75.0 - 51.8 %   MCV 85.1 80.0 - 100.0 fL    MCH 27.3 26.0 - 34.0 pg   MCHC 32.1 30.0 - 36.0 g/dL   RDW 33.5 82.5 - 18.9 %   Platelets 207 150 - 400 K/uL   nRBC 0.0 0.0 - 0.2 %    Comment: Performed at [facility], [address] [place], [place]  Comprehensive metabolic panel     Status: Abnormal   Collection Time: 10/20/21  2:14 PM  Result Value Ref Range   Sodium 137 135 - 145 mmol/L   Potassium 3.7 3.5 - 5.1 mmol/L   Chloride 107 98 - 111 mmol/L   CO2 21 (L) 22 - 32 mmol/L   Glucose, Bld 108 (H) 70 - 99 mg/dL    Comment: Glucose reference range applies only to samples taken after fasting for at least 8 hours.   BUN <5 (L) 6 - 20 mg/dL   Creatinine, Ser 14/03/22 0.44 - 1.00 mg/dL   Calcium 8.8 (L) 8.9 - 10.3 mg/dL   Total Protein 5.5 (L) 6.5 - 8.1 g/dL   Albumin 2.5 (L) 3.5 - 5.0 g/dL   AST 21 15 - 41 U/L   ALT 13 0 - 44 U/L   Alkaline Phosphatase 94 38 - 126 U/L   Total Bilirubin 0.2 (L) 0.3 - 1.2 mg/dL   GFR, Estimated 8.11 >88 mL/min    Comment: (NOTE) Calculated using the CKD-EPI Creatinine Equation (2021)    Anion gap 9 5 - 15    Comment: Performed at [facility], [address] [place], [place]  Amylase     Status: Abnormal   Collection Time: 10/20/21  2:14 PM  Result Value  Ref Range   Amylase 299 (H) 28 - 100 U/L    Comment: Performed at [facility], [address], [place], [place] [address]  Lipase, blood     Status: Abnormal   Collection Time: 10/20/21  2:14 PM  Result Value Ref Range   Lipase 134 (H) 11 - 51 U/L    Comment: Performed at [facility], [address] [place], [place] [address]  Resp Panel by RT-PCR (Flu A&B, Covid) Nasopharyngeal Swab     Status: None   Collection Time: 10/20/21  4:45 PM   Specimen: Nasopharyngeal Swab; Nasopharyngeal(NP) swabs in vial transport medium  Result Value Ref Range   SARS Coronavirus 2 by RT PCR NEGATIVE NEGATIVE    Comment: (NOTE) SARS-CoV-2 target nucleic acids are NOT DETECTED.  The SARS-CoV-2 RNA is generally  detectable in upper respiratory specimens during the acute phase of infection. The lowest concentration of SARS-CoV-2 viral copies this assay can detect is 138 copies/mL. A negative result does not preclude SARS-Cov-2 infection and should not be used as the sole basis for treatment or other patient management decisions. A negative result may occur with  improper specimen collection/handling, submission of specimen other than nasopharyngeal swab, presence of viral mutation(s) within the areas targeted by this assay, and inadequate number of viral copies(<138 copies/mL). A negative result must be combined with clinical observations, patient history, and epidemiological information. The expected result is Negative.  Fact Sheet for Patients:  BloggerCourse.com  Fact Sheet for Healthcare Providers:  SeriousBroker.it  This test is no t yet approved or cleared by the [place] FDA and  has been authorized for detection and/or diagnosis of SARS-CoV-2 by FDA under an Emergency Use Authorization (EUA). This EUA will remain  in effect (meaning this test can be used) for the duration of the COVID-19 declaration under Section 564(b)(1) of the Act, 21 U.S.C.section 360bbb-3(b)(1), unless the authorization is terminated  or revoked sooner.       Influenza A by PCR NEGATIVE NEGATIVE   Influenza B by PCR NEGATIVE NEGATIVE    Comment: (NOTE) The Xpert Xpress SARS-CoV-2/FLU/RSV plus assay is intended as an aid in the diagnosis of influenza from Nasopharyngeal swab specimens and should not be used as a sole basis for treatment. Nasal washings and aspirates are unacceptable for Xpert Xpress SARS-CoV-2/FLU/RSV testing.  Fact Sheet for Patients: BloggerCourse.com  Fact Sheet for Healthcare Providers: SeriousBroker.it  This test is not yet approved or cleared by the [place] FDA and has  been authorized for detection and/or diagnosis of SARS-CoV-2 by FDA under an Emergency Use Authorization (EUA). This EUA will remain in effect (meaning this test can be used) for the duration of the COVID-19 declaration under Section 564(b)(1) of the Act, 21 U.S.C. section 360bbb-3(b)(1), unless the authorization is terminated or revoked.  Performed at [facility], [address], [place], [place] [address]   CBC with Differential/Platelet     Status: Abnormal   Collection Time: 10/21/21  3:51 AM  Result Value Ref Range   WBC 9.0 4.0 - 10.5 K/uL   RBC 3.10 (L) 3.87 - 5.11 MIL/uL   Hemoglobin 8.3 (L) 12.0 - 15.0 g/dL   HCT 42.7 (L) 06.2 - 37.6 %   MCV 86.5 80.0 - 100.0 fL   MCH 26.8 26.0 - 34.0 pg   MCHC 31.0 30.0 - 36.0 g/dL   RDW 28.3 15.1 - 76.1 %   Platelets 187 150 - 400 K/uL   nRBC  0.0 0.0 - 0.2 %   Neutrophils Relative % 75 %   Neutro Abs 6.7 1.7 - 7.7 K/uL   Lymphocytes Relative 19 %   Lymphs Abs 1.7 0.7 - 4.0 K/uL   Monocytes Relative 6 %   Monocytes Absolute 0.6 0.1 - 1.0 K/uL   Eosinophils Relative 0 %   Eosinophils Absolute 0.0 0.0 - 0.5 K/uL   Basophils Relative 0 %   Basophils Absolute 0.0 0.0 - 0.1 K/uL   Immature Granulocytes 0 %   Abs Immature Granulocytes 0.03 0.00 - 0.07 K/uL    Comment: Performed at [facility], [address], [place], [place] [address]  Lipase, blood     Status: None   Collection Time: 10/21/21  3:51 AM  Result Value Ref Range   Lipase 33 11 - 51 U/L    Comment: Performed at [facility], [address], [place], [place] [address]  Amylase     Status: Abnormal   Collection Time: 10/21/21  3:51 AM  Result Value Ref Range   Amylase 151 (H) 28 - 100 U/L    Comment: Performed at [facility], [address], [place], [place] [address]  Comprehensive metabolic panel     Status: Abnormal   Collection Time: 10/21/21  3:51 AM  Result Value Ref Range   Sodium 134 (L) 135 - 145 mmol/L   Potassium 3.4 (L) 3.5 - 5.1  mmol/L   Chloride 103 98 - 111 mmol/L   CO2 23 22 - 32 mmol/L   Glucose, Bld 86 70 - 99 mg/dL    Comment: Glucose reference range applies only to samples taken after fasting for at least 8 hours.   BUN <5 (L) 6 - 20 mg/dL   Creatinine, Ser 9.14 0.44 - 1.00 mg/dL   Calcium 8.3 (L) 8.9 - 10.3 mg/dL   Total Protein 5.3 (L) 6.5 - 8.1 g/dL   Albumin 2.4 (L) 3.5 - 5.0 g/dL   AST 17 15 - 41 U/L   ALT 13 0 - 44 U/L   Alkaline Phosphatase 96 38 - 126 U/L   Total Bilirubin 0.6 0.3 - 1.2 mg/dL   GFR, Estimated >78 >29 mL/min    Comment: (NOTE) Calculated using the CKD-EPI Creatinine Equation (2021)    Anion gap 8 5 - 15    Comment: Performed at [facility], [address], [place], [place] [address]  Type and screen     Status: None   Collection Time: 10/21/21  3:51 AM  Result Value Ref Range   ABO/RH(D) O POS    Antibody Screen NEG    Sample Expiration      10/24/2021,2359 Performed at [facility], [address] [place], [place] [address]     US Abdomen Limited RUQ (LIVER/GB)  Result Date: 10/20/2021 CLINICAL DATA:  Right upper quadrant abdominal pain EXAM: ULTRASOUND ABDOMEN LIMITED RIGHT UPPER QUADRANT COMPARISON:  None. FINDINGS: Gallbladder: No gallstones or wall thickening visualized. No sonographic Murphy sign noted by sonographer. Common bile duct: Diameter: 2 mm Liver: No focal lesion identified. Within normal limits in parenchymal echogenicity. Portal vein is patent on color Doppler imaging with normal direction of blood flow towards the liver. Other: None. IMPRESSION: Negative right upper quadrant ultrasound. Electronically Signed   By: Charline Bills M.D.   On: 10/20/2021 02:23    Current scheduled medications  docusate sodium  100 mg Oral Daily   prenatal multivitamin  1 tablet  Oral Q1200    I have reviewed the patient's current medications.  ASSESSMENT: Principal Problem:   Acute pancreatitis Active Problems:   [redacted] weeks gestation of pregnancy    Anemia in pregnancy, third trimester   PLAN: Continue NPO, IV hydration, Dilaudid prn Hold on re-feeding now, may need to consult GI about when to do this based on her symptoms. Reassured by lipase normalizing this morning. No signs/symptoms of labor or other maternal-fetal distress, Category 1 FHR tracing. Continue routine antenatal care.   Jaynie Collins, MD, FACOG Obstetrician & Gynecologist, [facility] for [employer], [facility]

## 2021-10-21 NOTE — Progress Notes (Signed)
Called to review NST due to contractions.  Contracting irregularly and feeling some of them.  FHT otherwise reactive with normal baseline in 150s. No decels.  May take the patient off the monitor.

## 2021-10-22 LAB — CBC
HCT: 26.9 % — ABNORMAL LOW (ref 36.0–46.0)
Hemoglobin: 8.8 g/dL — ABNORMAL LOW (ref 12.0–15.0)
MCH: 28.2 pg (ref 26.0–34.0)
MCHC: 32.7 g/dL (ref 30.0–36.0)
MCV: 86.2 fL (ref 80.0–100.0)
Platelets: 183 10*3/uL (ref 150–400)
RBC: 3.12 MIL/uL — ABNORMAL LOW (ref 3.87–5.11)
RDW: 14 % (ref 11.5–15.5)
WBC: 9.9 10*3/uL (ref 4.0–10.5)
nRBC: 0 % (ref 0.0–0.2)

## 2021-10-22 LAB — AMYLASE: Amylase: 84 U/L (ref 28–100)

## 2021-10-22 LAB — COMPREHENSIVE METABOLIC PANEL
ALT: 13 U/L (ref 0–44)
AST: 18 U/L (ref 15–41)
Albumin: 2.2 g/dL — ABNORMAL LOW (ref 3.5–5.0)
Alkaline Phosphatase: 97 U/L (ref 38–126)
Anion gap: 9 (ref 5–15)
BUN: <5 mg/dL — ABNORMAL LOW (ref 6–20)
CO2: 20 mmol/L — ABNORMAL LOW (ref 22–32)
Calcium: 8.3 mg/dL — ABNORMAL LOW (ref 8.9–10.3)
Chloride: 106 mmol/L (ref 98–111)
Creatinine, Ser: 0.71 mg/dL (ref 0.44–1.00)
GFR, Estimated: >60 mL/min (ref >60)
Glucose, Bld: 72 mg/dL (ref 70–99)
Potassium: 3.1 mmol/L — ABNORMAL LOW (ref 3.5–5.1)
Sodium: 135 mmol/L (ref 135–145)
Total Bilirubin: 1.1 mg/dL (ref 0.3–1.2)
Total Protein: 5.2 g/dL — ABNORMAL LOW (ref 6.5–8.1)

## 2021-10-22 LAB — LIPASE, BLOOD: Lipase: 22 U/L (ref 11–51)

## 2021-10-22 MED ORDER — CYCLOBENZAPRINE HCL 10 MG PO TABS: 10.0000 mg | ORAL_TABLET | Freq: Three times a day (TID) | ORAL | 0 refills | Status: DC | PRN

## 2021-10-22 MED ORDER — POTASSIUM CHLORIDE CRYS ER 20 MEQ PO TBCR
40.0000 meq | EXTENDED_RELEASE_TABLET | Freq: Two times a day (BID) | ORAL | Status: DC
  Administered 2021-10-22: 40 meq via ORAL
  Filled 2021-10-22: qty 2

## 2021-10-22 NOTE — Discharge Summary (Signed)
Antenatal Physician Discharge Summary  Patient ID: Jill [name] MRN: [id number] DOB/AGE: [birth year] 25 y.o.  Admit date: 10/20/2021 Discharge date: 10/22/2021  Admission Diagnoses:Principal Problem:   Acute pancreatitis Active Problems:   [redacted] weeks gestation of pregnancy   Anemia in pregnancy, third trimester   Discharge Diagnoses: Same  Prenatal Procedures: none  Consults: Neonatology, Maternal Fetal Medicine  Hospital Course:  Jill [name] is a 25 y.o. G1P0000 with IUP at [redacted]w[redacted]d admitted for acute abdominal pain.  She was admitted with pancreatitis.  She reported no leaking of fluid and no bleeding.  She was given IV fluid hydration IV pain medication and was kept NPO.  Her diet was advanced slowly.  Her labs were trended and her lipase trended down and was normal at discharge.  She was tolerating p.o.  Her pain was much improved.  She was deemed stable for discharge to home with outpatient follow up.  Discharge Exam: Temp:  [98 F (36.7 C)-98.6 F (37 C)] 98 F (36.7 C) (12/05 1157) Pulse Rate:  [73-100] 80 (12/05 1157) Resp:  [16-18] 18 (12/05 1157) BP: (115-125)/(60-71) 115/65 (12/05 1157) SpO2:  [98 %-100 %] 100 % (12/05 1157) Physical Examination: CONSTITUTIONAL: Well-developed, well-nourished female in no acute distress.  HENT:  Normocephalic, atraumatic, External right and left ear normal.  EYES: Conjunctivae and EOM are normal. Pupils are equal, round, and reactive to light. No scleral icterus.  NECK: Normal range of motion, supple, no masses SKIN: Skin is warm and dry. No rash noted. Not diaphoretic. No erythema. No pallor. NEUROLOGIC: Alert and oriented to person, place, and time. Normal reflexes, muscle tone coordination. No cranial nerve deficit noted. PSYCHIATRIC: Normal mood and affect. Normal behavior. Normal judgment and thought content. CARDIOVASCULAR: Normal heart rate noted, regular rhythm RESPIRATORY: Effort and breath sounds normal, no problems with  respiration noted MUSCULOSKELETAL: Normal range of motion. No edema and no tenderness. 2+ distal pulses. ABDOMEN: Soft, nontender, nondistended, gravid. CERVIX: Dilation: Closed Effacement (%): Thick Cervical Position: Posterior Station: Ballotable Presentation: Vertex Exam by:: Dr Macon Large  Fetal monitoring: FHR: 140 bpm, Variability: moderate, Accelerations: Present, Decelerations: Absent  Uterine activity: 12 contractions per hour  Significant Diagnostic Studies:  Results for orders placed or performed during the hospital encounter of 10/20/21 (from the past 168 hour(s))  CBC   Collection Time: 10/20/21  2:14 PM  Result Value Ref Range   WBC 10.6 (H) 4.0 - 10.5 K/uL   RBC 3.22 (L) 3.87 - 5.11 MIL/uL   Hemoglobin 8.8 (L) 12.0 - 15.0 g/dL   HCT 40.9 (L) 81.1 - 91.4 %   MCV 85.1 80.0 - 100.0 fL   MCH 27.3 26.0 - 34.0 pg   MCHC 32.1 30.0 - 36.0 g/dL   RDW 78.2 95.6 - 21.3 %   Platelets 207 150 - 400 K/uL   nRBC 0.0 0.0 - 0.2 %  Comprehensive metabolic panel   Collection Time: 10/20/21  2:14 PM  Result Value Ref Range   Sodium 137 135 - 145 mmol/L   Potassium 3.7 3.5 - 5.1 mmol/L   Chloride 107 98 - 111 mmol/L   CO2 21 (L) 22 - 32 mmol/L   Glucose, Bld 108 (H) 70 - 99 mg/dL   BUN <5 (L) 6 - 20 mg/dL   Creatinine, Ser 0.86 0.44 - 1.00 mg/dL   Calcium 8.8 (L) 8.9 - 10.3 mg/dL   Total Protein 5.5 (L) 6.5 - 8.1 g/dL   Albumin 2.5 (L) 3.5 - 5.0 g/dL   AST  21 15 - 41 U/L   ALT 13 0 - 44 U/L   Alkaline Phosphatase 94 38 - 126 U/L   Total Bilirubin 0.2 (L) 0.3 - 1.2 mg/dL   GFR, Estimated >78 >29 mL/min   Anion gap 9 5 - 15  Amylase   Collection Time: 10/20/21  2:14 PM  Result Value Ref Range   Amylase 299 (H) 28 - 100 U/L  Lipase, blood   Collection Time: 10/20/21  2:14 PM  Result Value Ref Range   Lipase 134 (H) 11 - 51 U/L  Resp Panel by RT-PCR (Flu A&B, Covid) Nasopharyngeal Swab   Collection Time: 10/20/21  4:45 PM   Specimen: Nasopharyngeal Swab; Nasopharyngeal(NP)  swabs in vial transport medium  Result Value Ref Range   SARS Coronavirus 2 by RT PCR NEGATIVE NEGATIVE   Influenza A by PCR NEGATIVE NEGATIVE   Influenza B by PCR NEGATIVE NEGATIVE  CBC with Differential/Platelet   Collection Time: 10/21/21  3:51 AM  Result Value Ref Range   WBC 9.0 4.0 - 10.5 K/uL   RBC 3.10 (L) 3.87 - 5.11 MIL/uL   Hemoglobin 8.3 (L) 12.0 - 15.0 g/dL   HCT 56.2 (L) 13.0 - 86.5 %   MCV 86.5 80.0 - 100.0 fL   MCH 26.8 26.0 - 34.0 pg   MCHC 31.0 30.0 - 36.0 g/dL   RDW 78.4 69.6 - 29.5 %   Platelets 187 150 - 400 K/uL   nRBC 0.0 0.0 - 0.2 %   Neutrophils Relative % 75 %   Neutro Abs 6.7 1.7 - 7.7 K/uL   Lymphocytes Relative 19 %   Lymphs Abs 1.7 0.7 - 4.0 K/uL   Monocytes Relative 6 %   Monocytes Absolute 0.6 0.1 - 1.0 K/uL   Eosinophils Relative 0 %   Eosinophils Absolute 0.0 0.0 - 0.5 K/uL   Basophils Relative 0 %   Basophils Absolute 0.0 0.0 - 0.1 K/uL   Immature Granulocytes 0 %   Abs Immature Granulocytes 0.03 0.00 - 0.07 K/uL  Lipase, blood   Collection Time: 10/21/21  3:51 AM  Result Value Ref Range   Lipase 33 11 - 51 U/L  Amylase   Collection Time: 10/21/21  3:51 AM  Result Value Ref Range   Amylase 151 (H) 28 - 100 U/L  Comprehensive metabolic panel   Collection Time: 10/21/21  3:51 AM  Result Value Ref Range   Sodium 134 (L) 135 - 145 mmol/L   Potassium 3.4 (L) 3.5 - 5.1 mmol/L   Chloride 103 98 - 111 mmol/L   CO2 23 22 - 32 mmol/L   Glucose, Bld 86 70 - 99 mg/dL   BUN <5 (L) 6 - 20 mg/dL   Creatinine, Ser 2.84 0.44 - 1.00 mg/dL   Calcium 8.3 (L) 8.9 - 10.3 mg/dL   Total Protein 5.3 (L) 6.5 - 8.1 g/dL   Albumin 2.4 (L) 3.5 - 5.0 g/dL   AST 17 15 - 41 U/L   ALT 13 0 - 44 U/L   Alkaline Phosphatase 96 38 - 126 U/L   Total Bilirubin 0.6 0.3 - 1.2 mg/dL   GFR, Estimated >13 >24 mL/min   Anion gap 8 5 - 15  Type and screen   Collection Time: 10/21/21  3:51 AM  Result Value Ref Range   ABO/RH(D) O POS    Antibody Screen NEG    Sample  Expiration      10/24/2021,2359 Performed at [facility], [address], [place], [place] [address]   Amylase   Collection Time: 10/22/21  4:56 AM  Result Value Ref Range   Amylase 84 28 - 100 U/L  Lipase, blood   Collection Time: 10/22/21  4:56 AM  Result Value Ref Range   Lipase 22 11 - 51 U/L  Comprehensive metabolic panel   Collection Time: 10/22/21  4:56 AM  Result Value Ref Range   Sodium 135 135 - 145 mmol/L   Potassium 3.1 (L) 3.5 - 5.1 mmol/L   Chloride 106 98 - 111 mmol/L   CO2 20 (L) 22 - 32 mmol/L   Glucose, Bld 72 70 - 99 mg/dL   BUN <5 (L) 6 - 20 mg/dL   Creatinine, Ser 0.27 0.44 - 1.00 mg/dL   Calcium 8.3 (L) 8.9 - 10.3 mg/dL   Total Protein 5.2 (L) 6.5 - 8.1 g/dL   Albumin 2.2 (L) 3.5 - 5.0 g/dL   AST 18 15 - 41 U/L   ALT 13 0 - 44 U/L   Alkaline Phosphatase 97 38 - 126 U/L   Total Bilirubin 1.1 0.3 - 1.2 mg/dL   GFR, Estimated >25 >36 mL/min   Anion gap 9 5 - 15  CBC   Collection Time: 10/22/21  4:56 AM  Result Value Ref Range   WBC 9.9 4.0 - 10.5 K/uL   RBC 3.12 (L) 3.87 - 5.11 MIL/uL   Hemoglobin 8.8 (L) 12.0 - 15.0 g/dL   HCT 64.4 (L) 03.4 - 74.2 %   MCV 86.2 80.0 - 100.0 fL   MCH 28.2 26.0 - 34.0 pg   MCHC 32.7 30.0 - 36.0 g/dL   RDW 59.5 63.8 - 75.6 %   Platelets 183 150 - 400 K/uL   nRBC 0.0 0.0 - 0.2 %  Results for orders placed or performed during the hospital encounter of 10/19/21 (from the past 168 hour(s))  Urinalysis, Routine w reflex microscopic Urine, Clean Catch   Collection Time: 10/20/21  1:11 AM  Result Value Ref Range   Color, Urine YELLOW YELLOW   APPearance CLOUDY (A) CLEAR   Specific Gravity, Urine 1.024 1.005 - 1.030   pH 5.0 5.0 - 8.0   Glucose, UA NEGATIVE NEGATIVE mg/dL   Hgb urine dipstick NEGATIVE NEGATIVE   Bilirubin Urine NEGATIVE NEGATIVE   Ketones, ur 20 (A) NEGATIVE mg/dL   Protein, ur 30 (A) NEGATIVE mg/dL   Nitrite NEGATIVE NEGATIVE   Leukocytes,Ua NEGATIVE NEGATIVE   WBC, UA 0-5 0 - 5 WBC/hpf    Bacteria, UA RARE (A) NONE SEEN   Squamous Epithelial / LPF 0-5 0 - 5   Mucus PRESENT   CBC with Differential/Platelet   Collection Time: 10/20/21  1:11 AM  Result Value Ref Range   WBC 12.5 (H) 4.0 - 10.5 K/uL   RBC 3.70 (L) 3.87 - 5.11 MIL/uL   Hemoglobin 10.1 (L) 12.0 - 15.0 g/dL   HCT 43.3 (L) 29.5 - 18.8 %   MCV 86.2 80.0 - 100.0 fL   MCH 27.3 26.0 - 34.0 pg   MCHC 31.7 30.0 - 36.0 g/dL   RDW 41.6 60.6 - 30.1 %   Platelets 232 150 - 400 K/uL   nRBC 0.0 0.0 - 0.2 %   Neutrophils Relative % 79 %   Neutro Abs 9.9 (H) 1.7 - 7.7 K/uL   Lymphocytes Relative 15 %   Lymphs Abs 1.9 0.7 - 4.0 K/uL   Monocytes Relative 5 %   Monocytes Absolute 0.7 0.1 - 1.0  K/uL   Eosinophils Relative 0 %   Eosinophils Absolute 0.0 0.0 - 0.5 K/uL   Basophils Relative 0 %   Basophils Absolute 0.0 0.0 - 0.1 K/uL   Immature Granulocytes 1 %   Abs Immature Granulocytes 0.07 0.00 - 0.07 K/uL  Comprehensive metabolic panel   Collection Time: 10/20/21  1:11 AM  Result Value Ref Range   Sodium 136 135 - 145 mmol/L   Potassium 3.7 3.5 - 5.1 mmol/L   Chloride 105 98 - 111 mmol/L   CO2 21 (L) 22 - 32 mmol/L   Glucose, Bld 119 (H) 70 - 99 mg/dL   BUN 7 6 - 20 mg/dL   Creatinine, Ser 8.52 0.44 - 1.00 mg/dL   Calcium 8.7 (L) 8.9 - 10.3 mg/dL   Total Protein 6.0 (L) 6.5 - 8.1 g/dL   Albumin 2.7 (L) 3.5 - 5.0 g/dL   AST 18 15 - 41 U/L   ALT 16 0 - 44 U/L   Alkaline Phosphatase 109 38 - 126 U/L   Total Bilirubin 0.6 0.3 - 1.2 mg/dL   GFR, Estimated >77 >82 mL/min   Anion gap 10 5 - 15  Lipase, blood   Collection Time: 10/20/21  1:11 AM  Result Value Ref Range   Lipase 26 11 - 51 U/L  Amylase   Collection Time: 10/20/21  1:11 AM  Result Value Ref Range   Amylase 112 (H) 28 - 100 U/L  Culture, OB Urine   Collection Time: 10/20/21  1:23 AM   Specimen: Urine, Random  Result Value Ref Range   Specimen Description URINE, RANDOM    Special Requests NONE    Culture (A)     MULTIPLE SPECIES PRESENT,  SUGGEST RECOLLECTION NO GROUP B STREP (S.AGALACTIAE) ISOLATED Performed at [facility], [address] [place], [place] [address]    Report Status 10/21/2021 FINAL   Results for orders placed or performed in visit on 10/16/21 (from the past 168 hour(s))  Cervicovaginal ancillary only( [place])   Collection Time: 10/16/21  8:49 AM  Result Value Ref Range   Neisseria Gonorrhea Negative    Chlamydia Negative    Comment Normal Reference [place] Chlamydia - Negative    Comment      Normal Reference Range Neisseria Gonorrhea - Negative  Culture, beta strep (group b only)   Collection Time: 10/16/21  2:00 PM   Specimen: Vaginal/Rectal; Genital   VR  Result Value Ref Range   Strep Gp B Culture Negative Negative   US Abdomen Limited RUQ (LIVER/GB)  Result Date: 10/20/2021 CLINICAL DATA:  Right upper quadrant abdominal pain EXAM: ULTRASOUND ABDOMEN LIMITED RIGHT UPPER QUADRANT COMPARISON:  None. FINDINGS: Gallbladder: No gallstones or wall thickening visualized. No sonographic Murphy sign noted by sonographer. Common bile duct: Diameter: 2 mm Liver: No focal lesion identified. Within normal limits in parenchymal echogenicity. Portal vein is patent on color Doppler imaging with normal direction of blood flow towards the liver. Other: None. IMPRESSION: Negative right upper quadrant ultrasound. Electronically Signed   By: Charline Bills M.D.   On: 10/20/2021 02:23    Future Appointments  Date Time Provider Department Center  10/24/2021  1:50 PM Cheral Marker, CNM [facility]  10/30/2021  4:10 PM Cheral Marker, CNM [facility]  11/05/2021  8:30 AM Cheral Marker, CNM [facility]    Discharge Condition: Stable  Discharge disposition: 01-Home or Self Care       Discharge Instructions  Discharge activity:  No Restrictions   Complete by: As directed    Discharge diet:  No restrictions   Complete by: As directed    Fetal Kick Count:  Lie on our left  side for one hour after a meal, and count the number of times your baby kicks.  If it is less than 5 times, get up, move around and drink some juice.  Repeat the test 30 minutes later.  If it is still less than 5 kicks in an hour, notify your doctor.   Complete by: As directed    LABOR:  When conractions begin, you should start to time them from the beginning of one contraction to the beginning  of the next.  When contractions are 5 - 10 minutes apart or less and have been regular for at least an hour, you should call your health care provider.   Complete by: As directed    Notify physician for bleeding from the vagina   Complete by: As directed    Notify physician for blurring of vision or spots before the eyes   Complete by: As directed    Notify physician for chills or fever   Complete by: As directed    Notify physician for fainting spells, "black outs" or loss of consciousness   Complete by: As directed    Notify physician for increase in vaginal discharge   Complete by: As directed    Notify physician for leaking of fluid   Complete by: As directed    Notify physician for pain or burning when urinating   Complete by: As directed    Notify physician for pelvic pressure (sudden increase)   Complete by: As directed    Notify physician for severe or continued nausea or vomiting   Complete by: As directed    Notify physician for sudden gushing of fluid from the vagina (with or without continued leaking)   Complete by: As directed    Notify physician for sudden, constant, or occasional abdominal pain   Complete by: As directed    Notify physician if baby moving less than usual   Complete by: As directed       Allergies as of 10/22/2021   No Known Allergies      Medication List     TAKE these medications    cyclobenzaprine 10 MG tablet Commonly known as: FLEXERIL Take 1 tablet (10 mg total) by mouth 3 (three) times daily as needed for muscle spasms.   Flintstones Complete  10 MG Chew Chew 2 tablets by mouth daily.        Follow-up Information     [facility] Follow up.   Specialty: Obstetrics and Gynecology Why: keep next scheduled appointment Contact information: [address] [place] [address] [phone]                Total discharge time: 20 minutes   Signed: Reva Bores M.D. 10/22/2021, 3:31 PM

## 2021-10-23 ENCOUNTER — Encounter: Payer: Self-pay | Admitting: Women's Health

## 2021-10-24 ENCOUNTER — Other Ambulatory Visit: Payer: Self-pay

## 2021-10-24 ENCOUNTER — Encounter: Payer: Self-pay | Admitting: Women's Health

## 2021-10-24 ENCOUNTER — Ambulatory Visit (INDEPENDENT_AMBULATORY_CARE_PROVIDER_SITE_OTHER): Payer: BC Managed Care – PPO | Admitting: Women's Health

## 2021-10-24 VITALS — BP 102/70 | HR 95 | Wt 201.6 lb

## 2021-10-24 DIAGNOSIS — Z3403 Encounter for supervision of normal first pregnancy, third trimester: Secondary | ICD-10-CM

## 2021-10-24 MED ORDER — PROMETHAZINE HCL 25 MG PO TABS: 12.5000 mg | ORAL_TABLET | Freq: Four times a day (QID) | ORAL | 3 refills | Status: DC | PRN

## 2021-10-24 NOTE — Progress Notes (Signed)
LOW-RISK PREGNANCY VISIT Patient name: Jill [name] MRN [id number]  Date of birth: 1996/08/02 Chief Complaint:   Routine Prenatal Visit  History of Present Illness:   Jill [name] is a 25 y.o. G4P0000 female at [redacted]w[redacted]d with an Estimated Date of Delivery: 11/10/21 being seen today for ongoing management of a low-risk pregnancy.   Today she reports  hospitalized 12/3-12/5 w/ acute pancreatitis. Still having some RUQ pain, nothing like it was.  . Contractions: Irritability.  .  Movement: Present. denies leaking of fluid.  Depression screen [facility] 2/9 08/21/2021 05/11/2021 04/20/2020 04/08/2018 10/07/2017  Decreased Interest 0 0 0 0 0  Down, Depressed, Hopeless 0 0 0 0 0  PHQ - 2 Score 0 0 0 0 0  Altered sleeping 1 0 1 - -  Tired, decreased energy 1 1 1  - -  Change in appetite 0 0 0 - -  Feeling bad or failure about yourself  0 0 0 - -  Trouble concentrating 0 0 0 - -  Moving slowly or fidgety/restless 0 0 0 - -  Suicidal thoughts 0 0 0 - -  PHQ-9 Score 2 1 2  - -     GAD 7 : Generalized Anxiety Score 08/21/2021 04/20/2020  Nervous, Anxious, on Edge 0 0  Control/stop worrying 1 0  Worry too much - different things 1 0  Trouble relaxing 0 1  Restless 0 0  Easily annoyed or irritable 1 2  Afraid - awful might happen 0 0  Total GAD 7 Score 3 3      Review of Systems:   Pertinent items are noted in HPI Denies abnormal vaginal discharge w/ itching/odor/irritation, headaches, visual changes, shortness of breath, chest pain, abdominal pain, severe nausea/vomiting, or problems with urination or bowel movements unless otherwise stated above. Pertinent History Reviewed:  Reviewed past medical,surgical, social, obstetrical and family history.  Reviewed problem list, medications and allergies. Physical Assessment:   Vitals:   10/24/21 1357  BP: 102/70  Pulse: 95  Weight: 201 lb 9.6 oz (91.4 kg)  Body mass index is 36.87 kg/m.        Physical Examination:   General appearance: Well  appearing, and in no distress  Mental status: Alert, oriented to person, place, and time  Skin: Warm & dry  Cardiovascular: Normal heart rate noted  Respiratory: Normal respiratory effort, no distress  Abdomen: Soft, gravid, nontender  Pelvic: Cervical exam deferred         Extremities: Edema: Trace  Fetal Status: Fetal Heart Rate (bpm): 160 Fundal Height: 36 cm Movement: Present    Chaperone: N/A   No results found for this or any previous visit (from the past 24 hour(s)).  Assessment & Plan:  1) Low-risk pregnancy G1P0000 at [redacted]w[redacted]d with an Estimated Date of Delivery: 11/10/21   2) Recent acute pancreatitis, d/c labs on 12/5 had resumed to normal. Still some mild RUQ pain. Rx phenergan to have at home to relax sphincter, and for nausea. Let 11/12/21 know if anything worsens   Meds:  Meds ordered this encounter  Medications   promethazine (PHENERGAN) 25 MG tablet    Sig: Take 0.5-1 tablets (12.5-25 mg total) by mouth every 6 (six) hours as needed for nausea or vomiting.    Dispense:  30 tablet    Refill:  3    Order Specific Question:   Supervising Provider    Answer:   [place] [[id number]]   Labs/procedures today: none  Plan:  Continue  routine obstetrical care  Next visit: prefers in person    Reviewed: Term labor symptoms and general obstetric precautions including but not limited to vaginal bleeding, contractions, leaking of fluid and fetal movement were reviewed in detail with the patient.  All questions were answered. Does have home bp cuff. Office bp cuff given: not applicable. Check bp weekly, let us know if consistently >140 and/or >90.  Follow-up: Return for weekly, As scheduled.  Future Appointments  Date Time Provider Department Center  10/30/2021  4:10 PM Cheral Marker, [place] [facility]  11/05/2021  8:30 AM Cheral Marker, CNM [facility]    No orders of the defined types were placed in this encounter.  Cheral Marker CNM, [facility] 10/24/2021 2:32  PM

## 2021-10-24 NOTE — Patient Instructions (Signed)
Jill [name], thank you for choosing our office today! We appreciate the opportunity to meet your healthcare needs. You may receive a short survey by mail, e-mail, or through MyChart. If you are happy with your care we would appreciate if you could take just a few minutes to complete the survey questions. We read all of your comments and take your feedback very seriously. Thank you again for choosing our office.  °[facility] ° °[facility]  °([address] [place], [place] [address]) °Entrance C, located off of [address] °Free 24/7 valet parking  ° °CLASSES: Go to [email or web address] to register for classes (childbirth, breastfeeding, waterbirth, infant CPR, daddy bootcamp, etc.) ° °Call the office ([phone]) or go to [facility] if: °You begin to have strong, frequent contractions °Your water breaks.  Sometimes it is a big gush of fluid, sometimes it is just a trickle that keeps getting your panties wet or running down your legs °You have vaginal bleeding.  It is normal to have a small amount of spotting if your cervix was checked.  °You don't feel your baby moving like normal.  If you don't, get you something to eat and drink and lay down and focus on feeling your baby move.   If your baby is still not moving like normal, you should call the office or go to [facility]. ° °Call the office ([phone]) or go to [facility] for these signs of pre-eclampsia: °Severe headache that does not go away with Tylenol °Visual changes- seeing spots, double, blurred vision °Pain under your right breast or upper abdomen that does not go away with Tums or heartburn medicine °Nausea and/or vomiting °Severe swelling in your hands, feet, and face  ° °[place] Pediatricians/Family Doctors °[facility] ([facility]): [address], [phone]           °[facility]: [address], [phone]                 ° [facility] ([facility]): [address], [phone] (call to ask if accepting patients) °[facility]: [address], [place], [phone]   ° °[place] Pediatricians/Family Doctors °[facility] ([facility]): [address], [phone] °[facility]: [address], [phone] °[facility]: [address], [phone] ° °[place] Family [facility]  °[facility] ([facility]): [phone] °[facility]: [address], [phone]  ° °[place] Family Doctors °[facility]: [address], [phone] ° °[place] [facility]  °[facility]: [address], [phone] ° °Home Blood Pressure Monitoring for Patients  ° °Your provider has recommended that you check your blood pressure (BP) at least once a week at home. If you do not have a blood pressure cuff at home, one will be provided for you. Contact your provider if you have not received your monitor within 1 week.  ° °Helpful Tips for Accurate Home Blood Pressure Checks  °Don't smoke, exercise, or drink caffeine 30 minutes before checking your BP °Use the restroom before checking your BP (a full bladder can raise your pressure) °Relax in a comfortable upright chair °Feet on the ground °Left arm resting comfortably on a flat surface at the level of your heart °Legs uncrossed °Back supported °Sit quietly and don't talk °Place the cuff on your bare arm °Adjust snuggly, so that only two fingertips   can fit between your skin and the top of the cuff Check 2 readings separated by at least one minute Keep a log of your BP readings For a visual, please reference this diagram: http://ccnc.care/bpdiagram  Provider Name: [facility]     Phone: [phone]  Zone 1: ALL CLEAR  Continue to monitor your symptoms:  BP reading is less than 140 (top number) or less than 90 (bottom number)  No right  upper stomach pain No headaches or seeing spots No feeling nauseated or throwing up No swelling in face and hands  Zone 2: CAUTION Call your doctor's office for any of the following:  BP reading is greater than 140 (top number) or greater than 90 (bottom number)  Stomach pain under your ribs in the middle or right side Headaches or seeing spots Feeling nauseated or throwing up Swelling in face and hands  Zone 3: EMERGENCY  Seek immediate medical care if you have any of the following:  BP reading is greater than160 (top number) or greater than 110 (bottom number) Severe headaches not improving with Tylenol Serious difficulty catching your breath Any worsening symptoms from Zone 2   Braxton Hicks Contractions Contractions of the uterus can occur throughout pregnancy, but they are not always a sign that you are in labor. You may have practice contractions called Braxton Hicks contractions. These false labor contractions are sometimes confused with true labor. What are [place] contractions? Braxton Hicks contractions are tightening movements that occur in the muscles of the uterus before labor. Unlike true labor contractions, these contractions do not result in opening (dilation) and thinning of the cervix. Toward the end of pregnancy (32-34 weeks), Braxton Hicks contractions can happen more often and may become stronger. These contractions are sometimes difficult to tell apart from true labor because they can be very uncomfortable. You should not feel embarrassed if you go to the hospital with false labor. Sometimes, the only way to tell if you are in true labor is for your health care provider to look for changes in the cervix. The health care provider will do a physical exam and may monitor your contractions. If you are not in true labor, the exam should show that your cervix is not dilating and your water has not broken. If there are no other health problems associated with your  pregnancy, it is completely safe for you to be sent home with false labor. You may continue to have Braxton Hicks contractions until you go into true labor. How to tell the difference between true labor and false labor True labor Contractions last 30-70 seconds. Contractions become very regular. Discomfort is usually felt in the top of the uterus, and it spreads to the lower abdomen and low back. Contractions do not go away with walking. Contractions usually become more intense and increase in frequency. The cervix dilates and gets thinner. False labor Contractions are usually shorter and not as strong as true labor contractions. Contractions are usually irregular. Contractions are often felt in the front of the lower abdomen and in the groin. Contractions may go away when you walk around or change positions while lying down. Contractions get weaker and are shorter-lasting as time goes on. The cervix usually does not dilate or become thin. Follow these instructions at home:  Take over-the-counter and prescription medicines only as told by your health care provider. Keep up with your usual exercises and follow other instructions from your health care provider. Eat and drink lightly if you think  you are going into labor. If Braxton Hicks contractions are making you uncomfortable: Change your position from lying down or resting to walking, or change from walking to resting. Sit and rest in a tub of warm water. Drink enough fluid to keep your urine pale yellow. Dehydration may cause these contractions. Do slow and deep breathing several times an hour. Keep all follow-up prenatal visits as told by your health care provider. This is important. Contact a health care provider if: You have a fever. You have continuous pain in your abdomen. Get help right away if: Your contractions become stronger, more regular, and closer together. You have fluid leaking or gushing from your vagina. You pass  blood-tinged mucus (bloody show). You have bleeding from your vagina. You have low back pain that you never had before. You feel your baby's head pushing down and causing pelvic pressure. Your baby is not moving inside you as much as it used to. Summary Contractions that occur before labor are called Braxton Hicks contractions, false labor, or practice contractions. Braxton Hicks contractions are usually shorter, weaker, farther apart, and less regular than true labor contractions. True labor contractions usually become progressively stronger and regular, and they become more frequent. Manage discomfort from [facility] contractions by changing position, resting in a warm bath, drinking plenty of water, or practicing deep breathing. This information is not intended to replace advice given to you by your health care provider. Make sure you discuss any questions you have with your health care provider. Document Revised: 10/17/2017 Document Reviewed: 03/20/2017 Elsevier Patient Education  [place].

## 2021-10-30 ENCOUNTER — Other Ambulatory Visit: Payer: Self-pay

## 2021-10-30 ENCOUNTER — Encounter: Payer: Self-pay | Admitting: Women's Health

## 2021-10-30 ENCOUNTER — Ambulatory Visit (INDEPENDENT_AMBULATORY_CARE_PROVIDER_SITE_OTHER): Payer: BC Managed Care – PPO | Admitting: Women's Health

## 2021-10-30 VITALS — BP 120/74 | HR 100 | Wt 200.0 lb

## 2021-10-30 DIAGNOSIS — Z3403 Encounter for supervision of normal first pregnancy, third trimester: Secondary | ICD-10-CM

## 2021-10-30 NOTE — Patient Instructions (Signed)
Trevia, thank you for choosing our office today! We appreciate the opportunity to meet your healthcare needs. You may receive a short survey by mail, e-mail, or through [employer]. If you are happy with your care we would appreciate if you could take just a few minutes to complete the survey questions. We read all of your comments and take your feedback very seriously. Thank you again for choosing our office.  [facility] [employer] [facility] at [facility]  [facility] at [facility] ([address] [place], [place] [address]) Entrance C, located off of [address] Free 24/7 valet parking   CLASSES: Go to [email or web address] to register for classes (childbirth, breastfeeding, waterbirth, infant CPR, daddy bootcamp, etc.)  Call the office [phone]) or go to [facility] if: You begin to have strong, frequent contractions Your water breaks.  Sometimes it is a big gush of fluid, sometimes it is just a trickle that keeps getting your panties wet or running down your legs You have vaginal bleeding.  It is normal to have a small amount of spotting if your cervix was checked.  You don't feel your baby moving like normal.  If you don't, get you something to eat and drink and lay down and focus on feeling your baby move.   If your baby is still not moving like normal, you should call the office or go to [facility].  Call the office [phone]) or go to [facility] hospital for these signs of pre-eclampsia: Severe headache that does not go away with Tylenol Visual changes- seeing spots, double, blurred vision Pain under your right breast or upper abdomen that does not go away with Tums or heartburn medicine Nausea and/or vomiting Severe swelling in your hands, feet, and face   [facility] Pediatricians/Family Doctors [facility]): [address] Colette Ribas, [phone]           [facility]: [address], [phone]                 [facility]): [address], [phone] (call to ask if accepting patients) [facility]: [address], [place], [phone]    [place] Pediatricians/Family Doctors [facility]): [address], [phone] [facility]: [address] [place], [phone] [facility] [place]: [address], [phone]  [facility] Doctors  [facility]): [phone] [facility]: [address] [phone]   [facility] Doctors [facility]: [address], [phone]  [facility] Doctors  [facility]: [phone], [phone]  Home Blood Pressure Monitoring for Patients   Your provider has recommended that you check your blood pressure (BP) at least once a week at home. If you do not have a blood pressure cuff at home, one will be provided for you. Contact your provider if you have not received your monitor within 1 week.   Helpful Tips for Accurate Home Blood Pressure Checks  Don't smoke, exercise, or drink caffeine 30 minutes before checking your BP Use the restroom before checking your BP (a full bladder can raise your pressure) Relax in a comfortable upright chair Feet on the ground Left arm resting comfortably on a flat surface at the level of your heart Legs uncrossed Back supported Sit quietly and don't talk Place the cuff on your bare arm Adjust snuggly, so that only two fingertips  can fit between your skin and the top of the cuff Check 2 readings separated by at least one minute Keep a log of your BP readings For a visual, please reference this diagram: http://ccnc.care/bpdiagram  Provider Name: [facility]     Phone: [phone]  Zone 1: ALL CLEAR  Continue to monitor your symptoms:  BP reading is less than 140 (top number) or less than 90 (bottom number)  No right  upper stomach pain No headaches or seeing spots No feeling nauseated or throwing up No swelling in face and hands  Zone 2: CAUTION Call your doctor's office for any of the following:  BP reading is greater than 140 (top number) or greater than 90 (bottom number)  Stomach pain under your ribs in the middle or right side Headaches or seeing spots Feeling nauseated or throwing up Swelling in face and hands  Zone 3: EMERGENCY  Seek immediate medical care if you have any of the following:  BP reading is greater than160 (top number) or greater than 110 (bottom number) Severe headaches not improving with Tylenol Serious difficulty catching your breath Any worsening symptoms from Zone 2   Braxton Hicks Contractions Contractions of the uterus can occur throughout pregnancy, but they are not always a sign that you are in labor. You may have practice contractions called Braxton Hicks contractions. These false labor contractions are sometimes confused with true labor. What are [place] contractions? Braxton Hicks contractions are tightening movements that occur in the muscles of the uterus before labor. Unlike true labor contractions, these contractions do not result in opening (dilation) and thinning of the cervix. Toward the end of pregnancy (32-34 weeks), Braxton Hicks contractions can happen more often and may become stronger. These contractions are sometimes difficult to tell apart from true labor because they can be very uncomfortable. You should not feel embarrassed if you go to the hospital with false labor. Sometimes, the only way to tell if you are in true labor is for your health care provider to look for changes in the cervix. The health care provider will do a physical exam and may monitor your contractions. If you are not in true labor, the exam should show that your cervix is not dilating and your water has not broken. If there are no other health problems associated with your  pregnancy, it is completely safe for you to be sent home with false labor. You may continue to have Braxton Hicks contractions until you go into true labor. How to tell the difference between true labor and false labor True labor Contractions last 30-70 seconds. Contractions become very regular. Discomfort is usually felt in the top of the uterus, and it spreads to the lower abdomen and low back. Contractions do not go away with walking. Contractions usually become more intense and increase in frequency. The cervix dilates and gets thinner. False labor Contractions are usually shorter and not as strong as true labor contractions. Contractions are usually irregular. Contractions are often felt in the front of the lower abdomen and in the groin. Contractions may go away when you walk around or change positions while lying down. Contractions get weaker and are shorter-lasting as time goes on. The cervix usually does not dilate or become thin. Follow these instructions at home:  Take over-the-counter and prescription medicines only as told by your health care provider. Keep up with your usual exercises and follow other instructions from your health care provider. Eat and drink lightly if you think  you are going into labor. If Braxton Hicks contractions are making you uncomfortable: Change your position from lying down or resting to walking, or change from walking to resting. Sit and rest in a tub of warm water. Drink enough fluid to keep your urine pale yellow. Dehydration may cause these contractions. Do slow and deep breathing several times an hour. Keep all follow-up prenatal visits as told by your health care provider. This is important. Contact a health care provider if: You have a fever. You have continuous pain in your abdomen. Get help right away if: Your contractions become stronger, more regular, and closer together. You have fluid leaking or gushing from your vagina. You pass  blood-tinged mucus (bloody show). You have bleeding from your vagina. You have low back pain that you never had before. You feel your babys head pushing down and causing pelvic pressure. Your baby is not moving inside you as much as it used to. Summary Contractions that occur before labor are called Braxton Hicks contractions, false labor, or practice contractions. Braxton Hicks contractions are usually shorter, weaker, farther apart, and less regular than true labor contractions. True labor contractions usually become progressively stronger and regular, and they become more frequent. Manage discomfort from [facility] contractions by changing position, resting in a warm bath, drinking plenty of water, or practicing deep breathing. This information is not intended to replace advice given to you by your health care provider. Make sure you discuss any questions you have with your health care provider. Document Revised: 10/17/2017 Document Reviewed: 03/20/2017 Elsevier Patient Education  [place].

## 2021-10-30 NOTE — Progress Notes (Signed)
LOW-RISK PREGNANCY VISIT Patient name: Jill [name] MRN [id number]  Date of birth: [birth year] Chief Complaint:   Routine Prenatal Visit  History of Present Illness:   Jill [name] is a 25 y.o. G4P0000 female at [redacted]w[redacted]d with an Estimated Date of Delivery: 11/10/21 being seen today for ongoing management of a low-risk pregnancy.   Today she reports no complaints. Contractions: Irritability. Vag. Bleeding: None.  Movement: Present. denies leaking of fluid.  Depression screen [facility] 2/9 08/21/2021 05/11/2021 04/20/2020 04/08/2018 10/07/2017  Decreased Interest 0 0 0 0 0  Down, Depressed, Hopeless 0 0 0 0 0  PHQ - 2 Score 0 0 0 0 0  Altered sleeping 1 0 1 - -  Tired, decreased energy 1 1 1  - -  Change in appetite 0 0 0 - -  Feeling bad or failure about yourself  0 0 0 - -  Trouble concentrating 0 0 0 - -  Moving slowly or fidgety/restless 0 0 0 - -  Suicidal thoughts 0 0 0 - -  PHQ-9 Score 2 1 2  - -     GAD 7 : Generalized Anxiety Score 08/21/2021 04/20/2020  Nervous, Anxious, on Edge 0 0  Control/stop worrying 1 0  Worry too much - different things 1 0  Trouble relaxing 0 1  Restless 0 0  Easily annoyed or irritable 1 2  Afraid - awful might happen 0 0  Total GAD 7 Score 3 3      Review of Systems:   Pertinent items are noted in HPI Denies abnormal vaginal discharge w/ itching/odor/irritation, headaches, visual changes, shortness of breath, chest pain, abdominal pain, severe nausea/vomiting, or problems with urination or bowel movements unless otherwise stated above. Pertinent History Reviewed:  Reviewed past medical,surgical, social, obstetrical and family history.  Reviewed problem list, medications and allergies. Physical Assessment:   Vitals:   10/30/21 1608  BP: 120/74  Pulse: 100  Weight: 200 lb (90.7 kg)  Body mass index is 36.58 kg/m.        Physical Examination:   General appearance: Well appearing, and in no distress  Mental status: Alert, oriented to  person, place, and time  Skin: Warm & dry  Cardiovascular: Normal heart rate noted  Respiratory: Normal respiratory effort, no distress  Abdomen: Soft, gravid, nontender  Pelvic: Cervical exam deferred         Extremities: Edema: Trace  Fetal Status: Fetal Heart Rate (bpm): 150 Fundal Height: 37 cm Movement: Present Presentation: Vertex  Chaperone: N/A   No results found for this or any previous visit (from the past 24 hour(s)).  Assessment & Plan:  1) Low-risk pregnancy G1P0000 at [redacted]w[redacted]d with an Estimated Date of Delivery: 11/10/21    Meds: No orders of the defined types were placed in this encounter.  Labs/procedures today: none  Plan:  Continue routine obstetrical care  Next visit: prefers in person    Reviewed: Term labor symptoms and general obstetric precautions including but not limited to vaginal bleeding, contractions, leaking of fluid and fetal movement were reviewed in detail with the patient.  All questions were answered. Does have home bp cuff. Office bp cuff given: not applicable. Check bp weekly, let [redacted]w[redacted]d know if consistently >140 and/or >90.  Follow-up: Return for 12/19 as scheduled; needs appt following week for lrob w/ nst w/ cnm .  Future Appointments  Date Time Provider Department Center  11/05/2021  8:30 AM 1/20, CNM [facility]    No orders  of the defined types were placed in this encounter.  Cheral Marker CNM, [facility] 10/30/2021 4:21 PM

## 2021-11-05 ENCOUNTER — Encounter: Payer: Self-pay | Admitting: Women's Health

## 2021-11-05 ENCOUNTER — Ambulatory Visit (INDEPENDENT_AMBULATORY_CARE_PROVIDER_SITE_OTHER): Payer: BC Managed Care – PPO | Admitting: Women's Health

## 2021-11-05 ENCOUNTER — Other Ambulatory Visit: Payer: Self-pay

## 2021-11-05 VITALS — BP 120/76 | HR 102 | Wt 197.8 lb

## 2021-11-05 DIAGNOSIS — Z3403 Encounter for supervision of normal first pregnancy, third trimester: Secondary | ICD-10-CM

## 2021-11-05 NOTE — Progress Notes (Signed)
LOW-RISK PREGNANCY VISIT Patient name: Jill [name] MRN [id number]  Date of birth: 07/09/1996 Chief Complaint:   Routine Prenatal Visit  History of Present Illness:   Jill [name] is a 25 y.o. G49P0000 female at [redacted]w[redacted]d with an Estimated Date of Delivery: 11/10/21 being seen today for ongoing management of a low-risk pregnancy.   Today she reports  occ BH at home.   Hasn't really checked bp much at home. Denies ha, visual changes, ruq/epigastric pain, n/v.  1st bp today was on top of her bunched up sweatshirt, so accurate. Repeat normal.  Contractions: Irritability. Vag. Bleeding: None.  Movement: Present. denies leaking of fluid.  Depression screen [facility] 2/9 08/21/2021 05/11/2021 04/20/2020 04/08/2018 10/07/2017  Decreased Interest 0 0 0 0 0  Down, Depressed, Hopeless 0 0 0 0 0  PHQ - 2 Score 0 0 0 0 0  Altered sleeping 1 0 1 - -  Tired, decreased energy 1 1 1  - -  Change in appetite 0 0 0 - -  Feeling bad or failure about yourself  0 0 0 - -  Trouble concentrating 0 0 0 - -  Moving slowly or fidgety/restless 0 0 0 - -  Suicidal thoughts 0 0 0 - -  PHQ-9 Score 2 1 2  - -     GAD 7 : Generalized Anxiety Score 08/21/2021 04/20/2020  Nervous, Anxious, on Edge 0 0  Control/stop worrying 1 0  Worry too much - different things 1 0  Trouble relaxing 0 1  Restless 0 0  Easily annoyed or irritable 1 2  Afraid - awful might happen 0 0  Total GAD 7 Score 3 3      Review of Systems:   Pertinent items are noted in HPI Denies abnormal vaginal discharge w/ itching/odor/irritation, headaches, visual changes, shortness of breath, chest pain, abdominal pain, severe nausea/vomiting, or problems with urination or bowel movements unless otherwise stated above. Pertinent History Reviewed:  Reviewed past medical,surgical, social, obstetrical and family history.  Reviewed problem list, medications and allergies. Physical Assessment:   Vitals:   11/05/21 0833 11/05/21 0835  BP: 140/75 120/76   Pulse: 93 (!) 102  Weight: 197 lb 12.8 oz (89.7 kg)   Body mass index is 36.18 kg/m.        Physical Examination:   General appearance: Well appearing, and in no distress  Mental status: Alert, oriented to person, place, and time  Skin: Warm & dry  Cardiovascular: Normal heart rate noted  Respiratory: Normal respiratory effort, no distress  Abdomen: Soft, gravid, nontender  Pelvic: Cervical exam performed  Dilation: Fingertip Effacement (%): 90 Station: -2  Extremities: Edema: Trace  Fetal Status: Fetal Heart Rate (bpm): 144 Fundal Height: 37 cm Movement: Present Presentation: Vertex  Chaperone: Angel Neas   No results found for this or any previous visit (from the past 24 hour(s)).  Assessment & Plan:  1) Low-risk pregnancy G1P0000 at [redacted]w[redacted]d with an Estimated Date of Delivery: 11/10/21   2) Initially elevated bp, was taken on top of bunched up sweatshirt, repeat (w/o sweatshirt) normal and back to her baseline. Asymptomatic. Check bp's at least daily at home, let [redacted]w[redacted]d know/go to [facility] if >140/90 or pre-e s/s   Meds: No orders of the defined types were placed in this encounter.  Labs/procedures today: SVE  Plan:  Continue routine obstetrical care  Next visit: prefers will be in person for postdates nst     Reviewed: Term labor symptoms and general obstetric  precautions including but not limited to vaginal bleeding, contractions, leaking of fluid and fetal movement were reviewed in detail with the patient.  All questions were answered. Does have home bp cuff. Office bp cuff given: not applicable. Check bp daily, let us know if consistently >140 and/or >90.  Follow-up: Return for As scheduled.  Future Appointments  Date Time Provider Department Center  11/13/2021  3:10 PM Myna Hidalgo, DO [facility]    No orders of the defined types were placed in this encounter.  Cheral Marker CNM, [facility] 11/05/2021 8:52 AM

## 2021-11-05 NOTE — Patient Instructions (Signed)
Jill [name], thank you for choosing our office today! We appreciate the opportunity to meet your healthcare needs. You may receive a short survey by mail, e-mail, or through [employer]. If you are happy with your care we would appreciate if you could take just a few minutes to complete the survey questions. We read all of your comments and take your feedback very seriously. Thank you again for choosing our office.  Center for [employer] Team at [facility]  [facility] at [facility] ([address] [place], [place] [address]) Entrance C, located off of [address] Free 24/7 valet parking   CLASSES: Go to [email or web address] to register for classes (childbirth, breastfeeding, waterbirth, infant CPR, daddy bootcamp, etc.)  Call the office [phone]) or go to [facility] if: You begin to have strong, frequent contractions Your water breaks.  Sometimes it is a big gush of fluid, sometimes it is just a trickle that keeps getting your panties wet or running down your legs You have vaginal bleeding.  It is normal to have a small amount of spotting if your cervix was checked.  You don't feel your baby moving like normal.  If you don't, get you something to eat and drink and lay down and focus on feeling your baby move.   If your baby is still not moving like normal, you should call the office or go to [facility].  Call the office [phone]) or go to [facility] hospital for these signs of pre-eclampsia: Severe headache that does not go away with Tylenol Visual changes- seeing spots, double, blurred vision Pain under your right breast or upper abdomen that does not go away with Tums or heartburn medicine Nausea and/or vomiting Severe swelling in your hands, feet, and face   [facility] Pediatricians/Family Doctors [facility]): [address] Colette Ribas, [phone]           [facility]: [address], [phone]                 [facility]): [address], [phone] (call to ask if accepting patients) [facility] Department: [address], [place], [phone]    [place] Pediatricians/Family Doctors [facility]): [address], [phone] [facility]: [address] [place], [phone] [facility]: [address], [phone]  [facility] Doctors  [facility]): [phone] [facility]: [address] [phone]   [facility] Doctors [facility]: [address], [phone]  [facility] Doctors  [facility]: [phone], [phone]  Home Blood Pressure Monitoring for Patients   Your provider has recommended that you check your blood pressure (BP) at least once a week at home. If you do not have a blood pressure cuff at home, one will be provided for you. Contact your provider if you have not received your monitor within 1 week.   Helpful Tips for Accurate Home Blood Pressure Checks  Don't smoke, exercise, or drink caffeine 30 minutes before checking your BP Use the restroom before checking your BP (a full bladder can raise your pressure) Relax in a comfortable upright chair Feet on the ground Left arm resting comfortably on a flat surface at the level of your heart Legs uncrossed Back supported Sit quietly and don't talk Place the cuff on your bare arm Adjust snuggly, so that only two fingertips  can fit between your skin and the top of the cuff Check 2 readings separated by at least one minute Keep a log of your BP readings For a visual, please reference this diagram: http://ccnc.care/bpdiagram  Provider Name: [facility]     Phone: [phone]  Zone 1: ALL CLEAR  Continue to monitor your symptoms:  BP reading is less than 140 (top number) or less than 90 (bottom number)  No right  upper stomach pain No headaches or seeing spots No feeling nauseated or throwing up No swelling in face and hands  Zone 2: CAUTION Call your doctor's office for any of the following:  BP reading is greater than 140 (top number) or greater than 90 (bottom number)  Stomach pain under your ribs in the middle or right side Headaches or seeing spots Feeling nauseated or throwing up Swelling in face and hands  Zone 3: EMERGENCY  Seek immediate medical care if you have any of the following:  BP reading is greater than160 (top number) or greater than 110 (bottom number) Severe headaches not improving with Tylenol Serious difficulty catching your breath Any worsening symptoms from Zone 2   Braxton Hicks Contractions Contractions of the uterus can occur throughout pregnancy, but they are not always a sign that you are in labor. You may have practice contractions called Braxton Hicks contractions. These false labor contractions are sometimes confused with true labor. What are [place] contractions? Braxton Hicks contractions are tightening movements that occur in the muscles of the uterus before labor. Unlike true labor contractions, these contractions do not result in opening (dilation) and thinning of the cervix. Toward the end of pregnancy (32-34 weeks), Braxton Hicks contractions can happen more often and may become stronger. These contractions are sometimes difficult to tell apart from true labor because they can be very uncomfortable. You should not feel embarrassed if you go to the hospital with false labor. Sometimes, the only way to tell if you are in true labor is for your health care provider to look for changes in the cervix. The health care provider will do a physical exam and may monitor your contractions. If you are not in true labor, the exam should show that your cervix is not dilating and your water has not broken. If there are no other health problems associated with your  pregnancy, it is completely safe for you to be sent home with false labor. You may continue to have Braxton Hicks contractions until you go into true labor. How to tell the difference between true labor and false labor True labor Contractions last 30-70 seconds. Contractions become very regular. Discomfort is usually felt in the top of the uterus, and it spreads to the lower abdomen and low back. Contractions do not go away with walking. Contractions usually become more intense and increase in frequency. The cervix dilates and gets thinner. False labor Contractions are usually shorter and not as strong as true labor contractions. Contractions are usually irregular. Contractions are often felt in the front of the lower abdomen and in the groin. Contractions may go away when you walk around or change positions while lying down. Contractions get weaker and are shorter-lasting as time goes on. The cervix usually does not dilate or become thin. Follow these instructions at home:  Take over-the-counter and prescription medicines only as told by your health care provider. Keep up with your usual exercises and follow other instructions from your health care provider. Eat and drink lightly if you think  you are going into labor. If Braxton Hicks contractions are making you uncomfortable: Change your position from lying down or resting to walking, or change from walking to resting. Sit and rest in a tub of warm water. Drink enough fluid to keep your urine pale yellow. Dehydration may cause these contractions. Do slow and deep breathing several times an hour. Keep all follow-up prenatal visits as told by your health care provider. This is important. Contact a health care provider if: You have a fever. You have continuous pain in your abdomen. Get help right away if: Your contractions become stronger, more regular, and closer together. You have fluid leaking or gushing from your vagina. You pass  blood-tinged mucus (bloody show). You have bleeding from your vagina. You have low back pain that you never had before. You feel your babys head pushing down and causing pelvic pressure. Your baby is not moving inside you as much as it used to. Summary Contractions that occur before labor are called Braxton Hicks contractions, false labor, or practice contractions. Braxton Hicks contractions are usually shorter, weaker, farther apart, and less regular than true labor contractions. True labor contractions usually become progressively stronger and regular, and they become more frequent. Manage discomfort from [facility] contractions by changing position, resting in a warm bath, drinking plenty of water, or practicing deep breathing. This information is not intended to replace advice given to you by your health care provider. Make sure you discuss any questions you have with your health care provider. Document Revised: 10/17/2017 Document Reviewed: 03/20/2017 Elsevier Patient Education  [place].

## 2021-11-13 ENCOUNTER — Telehealth (HOSPITAL_COMMUNITY): Payer: Self-pay | Admitting: *Deleted

## 2021-11-13 ENCOUNTER — Ambulatory Visit (INDEPENDENT_AMBULATORY_CARE_PROVIDER_SITE_OTHER): Payer: BC Managed Care – PPO | Admitting: Obstetrics & Gynecology

## 2021-11-13 ENCOUNTER — Other Ambulatory Visit: Payer: Self-pay

## 2021-11-13 ENCOUNTER — Encounter: Payer: Self-pay | Admitting: Obstetrics & Gynecology

## 2021-11-13 VITALS — BP 124/72 | HR 88 | Wt 203.0 lb

## 2021-11-13 DIAGNOSIS — Z3403 Encounter for supervision of normal first pregnancy, third trimester: Secondary | ICD-10-CM | POA: Diagnosis not present

## 2021-11-13 DIAGNOSIS — O480 Post-term pregnancy: Secondary | ICD-10-CM

## 2021-11-13 NOTE — Telephone Encounter (Signed)
Preadmission screen  

## 2021-11-13 NOTE — Progress Notes (Signed)
° °  LOW-RISK PREGNANCY VISIT Patient name: Jill [name] MRN [id number]  Date of birth: 11/05/1996 Chief Complaint:   Routine Prenatal Visit and Non-stress Test  History of Present Illness:   Jill [name] is a 25 y.o. G31P0000 female at [redacted]w[redacted]d with an Estimated Date of Delivery: 11/10/21 being seen today for ongoing management of a low-risk pregnancy.   Depression screen [facility] 2/9 08/21/2021 05/11/2021 04/20/2020 04/08/2018 10/07/2017  Decreased Interest 0 0 0 0 0  Down, Depressed, Hopeless 0 0 0 0 0  PHQ - 2 Score 0 0 0 0 0  Altered sleeping 1 0 1 - -  Tired, decreased energy 1 1 1  - -  Change in appetite 0 0 0 - -  Feeling bad or failure about yourself  0 0 0 - -  Trouble concentrating 0 0 0 - -  Moving slowly or fidgety/restless 0 0 0 - -  Suicidal thoughts 0 0 0 - -  PHQ-9 Score 2 1 2  - -    Today she reports no complaints. Contractions: Irritability. Vag. Bleeding: None.  Movement: Present. denies leaking of fluid. Review of Systems:   Pertinent items are noted in HPI Denies abnormal vaginal discharge w/ itching/odor/irritation, headaches, visual changes, shortness of breath, chest pain, abdominal pain, severe nausea/vomiting, or problems with urination or bowel movements unless otherwise stated above. Pertinent History Reviewed:  Reviewed past medical,surgical, social, obstetrical and family history.  Reviewed problem list, medications and allergies.  Physical Assessment:   Vitals:   11/13/21 1506  BP: 124/72  Pulse: 88  Weight: 203 lb (92.1 kg)  Body mass index is 37.13 kg/m.        Physical Examination:   General appearance: Well appearing, and in no distress  Mental status: Alert, oriented to person, place, and time  Skin: Warm & dry  Respiratory: Normal respiratory effort, no distress  Abdomen: Soft, gravid, nontender  Pelvic: Cervical exam deferred         Extremities: Edema: Trace  Psych:  mood and affect appropriate  Fetal Status:     Movement: Present     Chaperone: n/a  NST being performed due to full term pregnancy   Fetal Monitoring:  Baseline: 140 bpm, Variability: moderate, Accelerations: present, The accelerations are >15 bpm and more than 2 in 20 minutes, and Decelerations: Absent   reactive   Final diagnosis:  Reactive NST     Assessment & Plan:  1) Low-risk pregnancy G1P0000 at [redacted]w[redacted]d with an Estimated Date of Delivery: 11/10/21   IOL scheduled for 1/2   Meds: No orders of the defined types were placed in this encounter.  Labs/procedures today: NST  Plan:  Continue routine obstetrical care   Reviewed: Term labor symptoms and general obstetric precautions including but not limited to vaginal bleeding, contractions, leaking of fluid and fetal movement were reviewed in detail with the patient.  All questions were answered. Pt has home bp cuff. Check bp weekly, let [redacted]w[redacted]d know if >140/90.   Follow-up: Return in about 6 weeks (around 12/25/2021) for postpartum visit.  No orders of the defined types were placed in this encounter.   [place], DO Attending Obstetrician & Gynecologist, [facility] for Myna Hidalgo, [facility]

## 2021-11-14 ENCOUNTER — Telehealth (HOSPITAL_COMMUNITY): Payer: Self-pay | Admitting: *Deleted

## 2021-11-14 NOTE — Telephone Encounter (Signed)
Preadmission screen  

## 2021-11-15 ENCOUNTER — Encounter: Payer: Self-pay | Admitting: Women's Health

## 2021-11-16 ENCOUNTER — Inpatient Hospital Stay (HOSPITAL_COMMUNITY): Payer: BC Managed Care – PPO | Admitting: Anesthesiology

## 2021-11-16 ENCOUNTER — Encounter (HOSPITAL_COMMUNITY): Payer: Self-pay | Admitting: Obstetrics and Gynecology

## 2021-11-16 ENCOUNTER — Other Ambulatory Visit (HOSPITAL_COMMUNITY): Payer: Self-pay | Admitting: *Deleted

## 2021-11-16 ENCOUNTER — Inpatient Hospital Stay (HOSPITAL_COMMUNITY)
Admission: AD | Admit: 2021-11-16 | Discharge: 2021-11-20 | DRG: 788 | Disposition: A | Discharge status: Home / Self Care | Payer: BC Managed Care – PPO | Attending: Obstetrics & Gynecology | Admitting: Obstetrics & Gynecology

## 2021-11-16 DIAGNOSIS — Z3A40 40 weeks gestation of pregnancy: Secondary | ICD-10-CM

## 2021-11-16 DIAGNOSIS — O26893 Other specified pregnancy related conditions, third trimester: Secondary | ICD-10-CM | POA: Diagnosis present

## 2021-11-16 DIAGNOSIS — K858 Other acute pancreatitis without necrosis or infection: Secondary | ICD-10-CM

## 2021-11-16 DIAGNOSIS — O480 Post-term pregnancy: Principal | ICD-10-CM | POA: Diagnosis present

## 2021-11-16 DIAGNOSIS — Z3A41 41 weeks gestation of pregnancy: Secondary | ICD-10-CM | POA: Diagnosis not present

## 2021-11-16 DIAGNOSIS — Z23 Encounter for immunization: Secondary | ICD-10-CM

## 2021-11-16 DIAGNOSIS — Z3403 Encounter for supervision of normal first pregnancy, third trimester: Secondary | ICD-10-CM

## 2021-11-16 DIAGNOSIS — Z20822 Contact with and (suspected) exposure to covid-19: Secondary | ICD-10-CM | POA: Diagnosis present

## 2021-11-16 DIAGNOSIS — O99013 Anemia complicating pregnancy, third trimester: Secondary | ICD-10-CM

## 2021-11-16 LAB — CBC
HCT: 36.5 % (ref 36.0–46.0)
Hemoglobin: 11.7 g/dL — ABNORMAL LOW (ref 12.0–15.0)
MCH: 27.9 pg (ref 26.0–34.0)
MCHC: 32.1 g/dL (ref 30.0–36.0)
MCV: 87.1 fL (ref 80.0–100.0)
Platelets: 204 10*3/uL (ref 150–400)
RBC: 4.19 MIL/uL (ref 3.87–5.11)
RDW: 18.2 % — ABNORMAL HIGH (ref 11.5–15.5)
WBC: 11.6 10*3/uL — ABNORMAL HIGH (ref 4.0–10.5)
nRBC: 0 % (ref 0.0–0.2)

## 2021-11-16 LAB — RESP PANEL BY RT-PCR (FLU A&B, COVID) ARPGX2
Influenza A by PCR: NEGATIVE
Influenza B by PCR: NEGATIVE
SARS Coronavirus 2 by RT PCR: NEGATIVE

## 2021-11-16 LAB — TYPE AND SCREEN
ABO/RH(D): O POS
Antibody Screen: NEGATIVE

## 2021-11-16 MED ORDER — FENTANYL CITRATE (PF) 100 MCG/2ML IJ SOLN
100.0000 ug | INTRAMUSCULAR | Status: DC | PRN
  Administered 2021-11-16: 100 ug via INTRAVENOUS
  Filled 2021-11-16 (×3): qty 2

## 2021-11-16 MED ORDER — OXYTOCIN-SODIUM CHLORIDE 30-0.9 UT/500ML-% IV SOLN
2.5000 [IU]/h | INTRAVENOUS | Status: DC
  Filled 2021-11-16: qty 500

## 2021-11-16 MED ORDER — OXYCODONE-ACETAMINOPHEN 5-325 MG PO TABS: 1.0000 | ORAL_TABLET | ORAL | Status: DC | PRN

## 2021-11-16 MED ORDER — DIPHENHYDRAMINE HCL 50 MG/ML IJ SOLN: 12.5000 mg | INTRAMUSCULAR | Status: DC | PRN

## 2021-11-16 MED ORDER — LACTATED RINGERS IV SOLN: 500.0000 mL | Freq: Once | INTRAVENOUS | Status: DC

## 2021-11-16 MED ORDER — FLEET ENEMA 7-19 GM/118ML RE ENEM: 1.0000 | ENEMA | RECTAL | Status: DC | PRN

## 2021-11-16 MED ORDER — LIDOCAINE HCL (PF) 1 % IJ SOLN
30.0000 mL | INTRAMUSCULAR | Status: AC | PRN
  Administered 2021-11-17: 30 mL via SUBCUTANEOUS
  Filled 2021-11-16: qty 30

## 2021-11-16 MED ORDER — SOD CITRATE-CITRIC ACID 500-334 MG/5ML PO SOLN
30.0000 mL | ORAL | Status: DC | PRN
  Administered 2021-11-17: 30 mL via ORAL
  Filled 2021-11-16: qty 30

## 2021-11-16 MED ORDER — LACTATED RINGERS IV SOLN: INTRAVENOUS | Status: DC

## 2021-11-16 MED ORDER — FENTANYL-BUPIVACAINE-NACL 0.5-0.125-0.9 MG/250ML-% EP SOLN
12.0000 mL/h | EPIDURAL | Status: DC | PRN
  Administered 2021-11-16 – 2021-11-17 (×2): 12 mL/h via EPIDURAL
  Filled 2021-11-16 (×2): qty 250

## 2021-11-16 MED ORDER — LACTATED RINGERS IV SOLN
500.0000 mL | INTRAVENOUS | Status: DC | PRN
  Administered 2021-11-17: 500 mL via INTRAVENOUS

## 2021-11-16 MED ORDER — PHENYLEPHRINE 40 MCG/ML (10ML) SYRINGE FOR IV PUSH (FOR BLOOD PRESSURE SUPPORT): 80.0000 ug | PREFILLED_SYRINGE | INTRAVENOUS | Status: DC | PRN

## 2021-11-16 MED ORDER — EPHEDRINE 5 MG/ML INJ: 10.0000 mg | INTRAVENOUS | Status: DC | PRN

## 2021-11-16 MED ORDER — ACETAMINOPHEN 325 MG PO TABS: 650.0000 mg | ORAL_TABLET | ORAL | Status: DC | PRN

## 2021-11-16 MED ORDER — OXYCODONE-ACETAMINOPHEN 5-325 MG PO TABS: 2.0000 | ORAL_TABLET | ORAL | Status: DC | PRN

## 2021-11-16 MED ORDER — OXYTOCIN BOLUS FROM INFUSION: 333.0000 mL | Freq: Once | INTRAVENOUS | Status: DC

## 2021-11-16 MED ORDER — ONDANSETRON HCL 4 MG/2ML IJ SOLN: 4.0000 mg | Freq: Four times a day (QID) | INTRAMUSCULAR | Status: DC | PRN

## 2021-11-16 NOTE — Anesthesia Preprocedure Evaluation (Signed)
Anesthesia Evaluation  Patient identified by MRN, date of birth, ID band Patient awake    Reviewed: Allergy & Precautions, Patient's Chart, lab work & pertinent test results  Airway Mallampati: II  TM Distance: >3 FB Neck ROM: Full    Dental  (+) Teeth Intact   Pulmonary neg pulmonary ROS,    Pulmonary exam normal breath sounds clear to auscultation       Cardiovascular negative cardio ROS Normal cardiovascular exam Rhythm:Regular Rate:Normal     Neuro/Psych negative neurological ROS     GI/Hepatic negative GI ROS, Neg liver ROS,   Endo/Other  negative endocrine ROS  Renal/GU negative Renal ROS     Musculoskeletal negative musculoskeletal ROS (+)   Abdominal (+) + obese,   Peds  Hematology  (+) Blood dyscrasia, anemia ,   Anesthesia Other Findings   Reproductive/Obstetrics (+) Pregnancy                             Anesthesia Physical Anesthesia Plan  ASA: 2  Anesthesia Plan: Epidural   Post-op Pain Management:    Induction:   PONV Risk Score and Plan:   Airway Management Planned:   Additional Equipment:   Intra-op Plan:   Post-operative Plan:   Informed Consent: I have reviewed the patients History and Physical, chart, labs and discussed the procedure including the risks, benefits and alternatives for the proposed anesthesia with the patient or authorized representative who has indicated his/her understanding and acceptance.       Plan Discussed with:   Anesthesia Plan Comments:         Anesthesia Quick Evaluation

## 2021-11-16 NOTE — MAU Note (Signed)
PT SAYS UC STRONG SINCE 5PM. PNC WITH [facility]  VE  FT GBS- NEG DENIES HSV

## 2021-11-16 NOTE — H&P (Signed)
OBSTETRIC ADMISSION HISTORY AND PHYSICAL  Jill [name] is a 25 y.o. female G1P0000 with IUP at [redacted]w[redacted]d by LMP presenting for spontaneous onset of labor. She reports +FMs, No LOF, no VB, no blurry vision, headaches or peripheral edema, and RUQ pain.  She plans on breast feeding. She request nothing for birth control. She received her prenatal care at [facility]   Dating: By LMP --->  Estimated Date of Delivery: 11/10/21    FAMILY TREE  RESULTS  Language English Pap 04/20/20 neg  Initiated care at 13wks GC/CT Initial:   -/-         36wks:  Dating by LMP c/w 9wk U/S    Support person  Genetics NT/IT: neg    Panorama: low-risk female  BP cuff office Carrier Screen neg    [place]/Hgb Elec neg  Rhogam n/a    TDaP vaccine 08/21/21  Blood Type O/Positive/-- (06/23 1443)  Flu vaccine 07/20/21  Antibody Negative (06/23 1443)  Covid vaccine  HBsAg Negative (06/23 1443)    RPR Non Reactive (06/23 1443)  Anatomy US Female 'Nova' bilateral CPC Rubella  <0.90 (06/23 1443)  Feeding Plan breast HIV Non Reactive (06/23 1443)  Contraception LARC Hep C neg  Circumcision n/a    Pediatrician Probably Luking A1C/GTT Early:      26-28wks: normal  Prenatal Classes discussed      GBS   neg  [ ]  PCN allergy  BTL Consent n/a    VBAC Consent  PHQ9 & GAD7  [ ] New OB  [ ] 28wks   [ ] 36wks  Waterbirth [ ] Class [ ]  36wkCNM visit/consent     Prenatal History/Complications: anemia in pregnancy  Past Medical History: Past Medical History:  Diagnosis Date   Medical history non-contributory     Past Surgical History: Past Surgical History:  Procedure Laterality Date   WISDOM TOOTH EXTRACTION Bilateral     Obstetrical History: OB History     Gravida  1   Para  0   Term  0   Preterm  0   AB  0   Living  0      SAB  0   IAB  0   Ectopic  0   Multiple  0   Live Births  0           Social History Social History   Socioeconomic History   Marital status: Married    Spouse name: Not on  file   Number of children: Not on file   Years of education: Not on file   Highest education level: Not on file  Occupational History   Not on file  Tobacco Use   Smoking status: Never   Smokeless tobacco: Never  Vaping Use   Vaping Use: Never used  Substance and Sexual Activity   Alcohol use: Not Currently    Comment: occasional   Drug use: Never   Sexual activity: Not Currently    Birth control/protection: None  Other Topics Concern   Not on file  Social History Narrative   Not on file   Social Determinants of Health   Financial Resource Strain: Low Risk    Difficulty of Paying Living Expenses: Not hard at all  Food Insecurity: No Food Insecurity   Worried About in the Last Year: Never true   Ran Out of Food in the Last Year: Never true  Transportation Needs: No Transportation Needs   Lack of Transportation (Medical): No  Lack of Transportation (Non-Medical): No  Physical Activity: Sufficiently Active   Days of Exercise per Week: 4 days   Minutes of Exercise per Session: 60 min  Stress: No Stress Concern Present   Feeling of Stress : Only a little  Social Connections: Moderately Isolated   Frequency of Communication with Friends and Family: Twice a week   Frequency of Social Gatherings with Friends and Family: Once a week   Attends Religious Services: Never   Database administrator or Organizations: No   Attends Engineer, structural: Never   Marital Status: Married    Family History: Family History  Problem Relation Age of Onset   Diverticulitis Mother    Gallbladder disease Father    Gallbladder disease Sister    Von Willebrand disease Sister    Alcohol abuse Maternal Grandfather    Diverticulitis Maternal Grandfather    Cancer Maternal Grandfather        lung   Cancer Paternal Grandmother    Hypertension Paternal Grandfather    Cerebral aneurysm Paternal Grandfather     Allergies: No Known Allergies  Medications Prior  to Admission  Medication Sig Dispense Refill Last Dose   cyclobenzaprine (FLEXERIL) 10 MG tablet Take 1 tablet (10 mg total) by mouth 3 (three) times daily as needed for muscle spasms. (Patient not taking: Reported on 10/24/2021) 30 tablet 0    ondansetron (ZOFRAN-ODT) 4 MG disintegrating tablet Take 4 mg by mouth every 8 (eight) hours as needed. (Patient not taking: Reported on 11/05/2021)      Pediatric Multivitamins-Iron (FLINTSTONES COMPLETE) 10 MG CHEW Chew 2 tablets by mouth daily.      promethazine (PHENERGAN) 25 MG tablet Take 0.5-1 tablets (12.5-25 mg total) by mouth every 6 (six) hours as needed for nausea or vomiting. (Patient not taking: Reported on 11/05/2021) 30 tablet 3    pseudoephedrine (SUDAFED) 30 MG tablet Take 30 mg by mouth every 4 (four) hours as needed for congestion.        Review of Systems   All systems reviewed and negative except as stated in HPI  Blood pressure 124/78, pulse 97, temperature 97.8 F (36.6 C), temperature source Oral, resp. rate 20, height 5\' 2"  (1.575 m), weight 90.6 kg, last menstrual period 02/03/2021. General appearance: alert, cooperative, and mild distress Lungs: clear to auscultation bilaterally Heart: regular rate and rhythm Abdomen: soft, non-tender; bowel sounds normal Pelvic: n/a Extremities: Homans sign is negative, no sign of DVT DTR's +2 Presentation: cephalic Fetal monitoringBaseline: 140 bpm, Variability: Good {> 6 bpm), Accelerations: Reactive, and Decelerations: Absent Uterine activityFrequency: Every 5-7 minutes Dilation: 5 Effacement (%): 100 Exam by:: [email or web address], CNM   Prenatal labs: ABO, Rh: --/--/O POS (12/04 0351) Antibody: NEG (12/04 0351) Rubella: <0.90 (06/23 1443) RPR: Non Reactive (10/04 0828)  HBsAg: Negative (06/23 1443)  HIV: Non Reactive (10/04 0828)  GBS: Negative/-- (11/29 1400)  Prenatal Transfer Tool  Maternal Diabetes: No Genetic Screening: Normal Maternal Ultrasounds/Referrals:  Normal Fetal Ultrasounds or other Referrals:  None Maternal Substance Abuse:  No Significant Maternal Medications:  None Significant Maternal Lab Results: Group B Strep negative  No results found for this or any previous visit (from the past 24 hour(s)).  Patient Active Problem List   Diagnosis Date Noted   Anemia in pregnancy, third trimester 10/21/2021   Acute pancreatitis 10/20/2021   Rubella non-immune status, antepartum 05/11/2021   Supervision of normal first pregnancy 05/02/2021    Assessment/Plan:  ILITHYIA TITZER is a 25 y.o.  G1P0000 at [redacted]w[redacted]d here for spontaneous onset of labor  #Labor:expectant management at this time #Pain: Planning epidural #FWB: Cat 1 #ID:  GBS neg #MOF: Breast #MOC: Unsure #Circ:  N/a  Rolm Bookbinder, CNM  11/16/2021, 9:39 PM

## 2021-11-17 ENCOUNTER — Encounter (HOSPITAL_COMMUNITY): Payer: Self-pay | Admitting: Obstetrics and Gynecology

## 2021-11-17 ENCOUNTER — Other Ambulatory Visit: Payer: Self-pay

## 2021-11-17 ENCOUNTER — Encounter (HOSPITAL_COMMUNITY)
Admission: AD | Disposition: A | Discharge status: Home / Self Care | Payer: Self-pay | Source: Home / Self Care | Attending: Obstetrics & Gynecology

## 2021-11-17 DIAGNOSIS — O480 Post-term pregnancy: Secondary | ICD-10-CM | POA: Diagnosis not present

## 2021-11-17 DIAGNOSIS — Z3A41 41 weeks gestation of pregnancy: Secondary | ICD-10-CM | POA: Diagnosis not present

## 2021-11-17 LAB — RPR: RPR Ser Ql: NONREACTIVE

## 2021-11-17 SURGERY — Surgical Case
Anesthesia: Spinal | Wound class: Clean Contaminated

## 2021-11-17 MED ORDER — SODIUM CHLORIDE 0.9 % IV SOLN
500.0000 mg | INTRAVENOUS | Status: AC
  Administered 2021-11-17: 500 mg via INTRAVENOUS

## 2021-11-17 MED ORDER — KETOROLAC TROMETHAMINE 30 MG/ML IJ SOLN
30.0000 mg | Freq: Four times a day (QID) | INTRAMUSCULAR | Status: AC | PRN
  Administered 2021-11-17: 30 mg via INTRAVENOUS

## 2021-11-17 MED ORDER — SIMETHICONE 80 MG PO CHEW
80.0000 mg | CHEWABLE_TABLET | Freq: Three times a day (TID) | ORAL | Status: DC
  Administered 2021-11-18 – 2021-11-20 (×6): 80 mg via ORAL
  Filled 2021-11-17 (×6): qty 1

## 2021-11-17 MED ORDER — SODIUM CHLORIDE 0.9% FLUSH: 3.0000 mL | INTRAVENOUS | Status: DC | PRN

## 2021-11-17 MED ORDER — FENTANYL CITRATE (PF) 100 MCG/2ML IJ SOLN
INTRAMUSCULAR | Status: DC | PRN
  Administered 2021-11-17: 15 ug via INTRATHECAL

## 2021-11-17 MED ORDER — COCONUT OIL OIL: 1.0000 "application " | TOPICAL_OIL | Status: DC | PRN

## 2021-11-17 MED ORDER — FENTANYL CITRATE (PF) 100 MCG/2ML IJ SOLN
INTRAMUSCULAR | Status: AC
  Filled 2021-11-17: qty 2

## 2021-11-17 MED ORDER — SENNOSIDES-DOCUSATE SODIUM 8.6-50 MG PO TABS
2.0000 | ORAL_TABLET | Freq: Every day | ORAL | Status: DC
  Administered 2021-11-18: 2 via ORAL
  Filled 2021-11-17 (×2): qty 2

## 2021-11-17 MED ORDER — KETOROLAC TROMETHAMINE 30 MG/ML IJ SOLN
30.0000 mg | Freq: Four times a day (QID) | INTRAMUSCULAR | Status: AC
  Administered 2021-11-18 (×3): 30 mg via INTRAVENOUS
  Filled 2021-11-17 (×3): qty 1

## 2021-11-17 MED ORDER — CEFAZOLIN SODIUM-DEXTROSE 2-3 GM-%(50ML) IV SOLR
INTRAVENOUS | Status: DC | PRN
  Administered 2021-11-17: 2 g via INTRAVENOUS

## 2021-11-17 MED ORDER — LIDOCAINE-EPINEPHRINE (PF) 1.5 %-1:200000 IJ SOLN
INTRAMUSCULAR | Status: DC | PRN
  Administered 2021-11-16: 3 mL via EPIDURAL

## 2021-11-17 MED ORDER — FENTANYL CITRATE (PF) 100 MCG/2ML IJ SOLN: 25.0000 ug | INTRAMUSCULAR | Status: DC | PRN

## 2021-11-17 MED ORDER — SODIUM CHLORIDE 0.9 % IV SOLN
INTRAVENOUS | Status: AC
  Filled 2021-11-17: qty 5

## 2021-11-17 MED ORDER — DEXAMETHASONE SODIUM PHOSPHATE 10 MG/ML IJ SOLN
INTRAMUSCULAR | Status: AC
  Filled 2021-11-17: qty 1

## 2021-11-17 MED ORDER — OXYCODONE HCL 5 MG PO TABS: 5.0000 mg | ORAL_TABLET | Freq: Once | ORAL | Status: DC | PRN

## 2021-11-17 MED ORDER — ONDANSETRON HCL 4 MG/2ML IJ SOLN: 4.0000 mg | Freq: Three times a day (TID) | INTRAMUSCULAR | Status: DC | PRN

## 2021-11-17 MED ORDER — LACTATED RINGERS IV SOLN: INTRAVENOUS | Status: DC | PRN

## 2021-11-17 MED ORDER — FERROUS SULFATE 325 (65 FE) MG PO TABS
325.0000 mg | ORAL_TABLET | ORAL | Status: DC
  Administered 2021-11-18 – 2021-11-20 (×2): 325 mg via ORAL
  Filled 2021-11-17 (×2): qty 1

## 2021-11-17 MED ORDER — MORPHINE SULFATE (PF) 0.5 MG/ML IJ SOLN
INTRAMUSCULAR | Status: DC | PRN
  Administered 2021-11-17: .15 mg via INTRATHECAL

## 2021-11-17 MED ORDER — SIMETHICONE 80 MG PO CHEW: 80.0000 mg | CHEWABLE_TABLET | ORAL | Status: DC | PRN

## 2021-11-17 MED ORDER — MORPHINE SULFATE (PF) 0.5 MG/ML IJ SOLN
INTRAMUSCULAR | Status: AC
  Filled 2021-11-17: qty 10

## 2021-11-17 MED ORDER — LIDOCAINE HCL (PF) 1 % IJ SOLN
INTRAMUSCULAR | Status: DC | PRN
  Administered 2021-11-16: 3 mL via EPIDURAL

## 2021-11-17 MED ORDER — ACETAMINOPHEN 160 MG/5ML PO SOLN: 325.0000 mg | ORAL | Status: DC | PRN

## 2021-11-17 MED ORDER — TETANUS-DIPHTH-ACELL PERTUSSIS 5-2.5-18.5 LF-MCG/0.5 IM SUSY: 0.5000 mL | PREFILLED_SYRINGE | Freq: Once | INTRAMUSCULAR | Status: DC

## 2021-11-17 MED ORDER — TERBUTALINE SULFATE 1 MG/ML IJ SOLN: 0.2500 mg | Freq: Once | INTRAMUSCULAR | Status: DC | PRN

## 2021-11-17 MED ORDER — DIPHENHYDRAMINE HCL 50 MG/ML IJ SOLN: 12.5000 mg | INTRAMUSCULAR | Status: DC | PRN

## 2021-11-17 MED ORDER — METOCLOPRAMIDE HCL 5 MG/ML IJ SOLN
INTRAMUSCULAR | Status: AC
  Filled 2021-11-17: qty 2

## 2021-11-17 MED ORDER — OXYTOCIN-SODIUM CHLORIDE 30-0.9 UT/500ML-% IV SOLN
INTRAVENOUS | Status: AC
  Filled 2021-11-17: qty 500

## 2021-11-17 MED ORDER — NALOXONE HCL 0.4 MG/ML IJ SOLN: 0.4000 mg | INTRAMUSCULAR | Status: DC | PRN

## 2021-11-17 MED ORDER — DEXAMETHASONE SODIUM PHOSPHATE 10 MG/ML IJ SOLN
INTRAMUSCULAR | Status: DC | PRN
  Administered 2021-11-17: 10 mg via INTRAVENOUS

## 2021-11-17 MED ORDER — KETOROLAC TROMETHAMINE 30 MG/ML IJ SOLN: 30.0000 mg | Freq: Four times a day (QID) | INTRAMUSCULAR | Status: AC | PRN

## 2021-11-17 MED ORDER — IBUPROFEN 600 MG PO TABS
600.0000 mg | ORAL_TABLET | Freq: Four times a day (QID) | ORAL | Status: DC
  Administered 2021-11-18 – 2021-11-20 (×7): 600 mg via ORAL
  Filled 2021-11-17 (×7): qty 1

## 2021-11-17 MED ORDER — MENTHOL 3 MG MT LOZG: 1.0000 | LOZENGE | OROMUCOSAL | Status: DC | PRN

## 2021-11-17 MED ORDER — TRANEXAMIC ACID-NACL 1000-0.7 MG/100ML-% IV SOLN
INTRAVENOUS | Status: DC | PRN
  Administered 2021-11-17: 1000 mg via INTRAVENOUS

## 2021-11-17 MED ORDER — OXYCODONE HCL 5 MG/5ML PO SOLN: 5.0000 mg | Freq: Once | ORAL | Status: DC | PRN

## 2021-11-17 MED ORDER — CEFAZOLIN SODIUM-DEXTROSE 2-4 GM/100ML-% IV SOLN
INTRAVENOUS | Status: AC
  Filled 2021-11-17: qty 100

## 2021-11-17 MED ORDER — ONDANSETRON HCL 4 MG/2ML IJ SOLN
INTRAMUSCULAR | Status: DC | PRN
  Administered 2021-11-17: 4 mg via INTRAVENOUS

## 2021-11-17 MED ORDER — KETOROLAC TROMETHAMINE 30 MG/ML IJ SOLN
INTRAMUSCULAR | Status: AC
  Filled 2021-11-17: qty 1

## 2021-11-17 MED ORDER — OXYTOCIN-SODIUM CHLORIDE 30-0.9 UT/500ML-% IV SOLN: 2.5000 [IU]/h | INTRAVENOUS | Status: AC

## 2021-11-17 MED ORDER — BUPIVACAINE IN DEXTROSE 0.75-8.25 % IT SOLN
INTRATHECAL | Status: DC | PRN
  Administered 2021-11-17: 1 mL via INTRATHECAL

## 2021-11-17 MED ORDER — DIPHENHYDRAMINE HCL 25 MG PO CAPS: 25.0000 mg | ORAL_CAPSULE | ORAL | Status: DC | PRN

## 2021-11-17 MED ORDER — SODIUM CHLORIDE 0.9 % IR SOLN
Status: DC | PRN
  Administered 2021-11-17: 1

## 2021-11-17 MED ORDER — BUPIVACAINE HCL (PF) 0.25 % IJ SOLN
INTRAMUSCULAR | Status: DC | PRN
  Administered 2021-11-17: 5 mL via EPIDURAL

## 2021-11-17 MED ORDER — DEXMEDETOMIDINE (PRECEDEX) IN NS 20 MCG/5ML (4 MCG/ML) IV SYRINGE
PREFILLED_SYRINGE | INTRAVENOUS | Status: DC | PRN
  Administered 2021-11-17 (×2): 4 ug via INTRAVENOUS

## 2021-11-17 MED ORDER — LIDOCAINE-EPINEPHRINE (PF) 2 %-1:200000 IJ SOLN
INTRAMUSCULAR | Status: DC | PRN
  Administered 2021-11-17: 5 mL via EPIDURAL
  Administered 2021-11-17: 4 mL via EPIDURAL
  Administered 2021-11-17 (×2): 5 mL via EPIDURAL
  Administered 2021-11-17: 4 mL via EPIDURAL
  Administered 2021-11-17 (×2): 5 mL via EPIDURAL

## 2021-11-17 MED ORDER — PHENYLEPHRINE HCL-NACL 20-0.9 MG/250ML-% IV SOLN
INTRAVENOUS | Status: DC | PRN
  Administered 2021-11-17: 60 ug/min via INTRAVENOUS

## 2021-11-17 MED ORDER — MEPERIDINE HCL 25 MG/ML IJ SOLN: 6.2500 mg | INTRAMUSCULAR | Status: DC | PRN

## 2021-11-17 MED ORDER — LACTATED RINGERS IV SOLN: INTRAVENOUS | Status: DC

## 2021-11-17 MED ORDER — ACETAMINOPHEN 325 MG PO TABS
650.0000 mg | ORAL_TABLET | ORAL | Status: DC | PRN
  Administered 2021-11-18: 650 mg via ORAL
  Filled 2021-11-17: qty 2

## 2021-11-17 MED ORDER — DEXMEDETOMIDINE (PRECEDEX) IN NS 20 MCG/5ML (4 MCG/ML) IV SYRINGE
PREFILLED_SYRINGE | INTRAVENOUS | Status: AC
  Filled 2021-11-17: qty 5

## 2021-11-17 MED ORDER — DIBUCAINE (PERIANAL) 1 % EX OINT: 1.0000 "application " | TOPICAL_OINTMENT | CUTANEOUS | Status: DC | PRN

## 2021-11-17 MED ORDER — CEFAZOLIN SODIUM-DEXTROSE 2-4 GM/100ML-% IV SOLN: 2.0000 g | INTRAVENOUS | Status: DC

## 2021-11-17 MED ORDER — NALOXONE HCL 4 MG/10ML IJ SOLN
1.0000 ug/kg/h | INTRAVENOUS | Status: DC | PRN
  Filled 2021-11-17: qty 5

## 2021-11-17 MED ORDER — CHLOROPROCAINE HCL (PF) 3 % IJ SOLN: INTRAMUSCULAR | Status: DC | PRN

## 2021-11-17 MED ORDER — WITCH HAZEL-GLYCERIN EX PADS: 1.0000 "application " | MEDICATED_PAD | CUTANEOUS | Status: DC | PRN

## 2021-11-17 MED ORDER — FENTANYL CITRATE (PF) 100 MCG/2ML IJ SOLN
INTRAMUSCULAR | Status: DC | PRN
  Administered 2021-11-17 (×2): 50 ug via INTRAVENOUS
  Administered 2021-11-17: 35 ug via INTRAVENOUS

## 2021-11-17 MED ORDER — ONDANSETRON HCL 4 MG/2ML IJ SOLN
INTRAMUSCULAR | Status: AC
  Filled 2021-11-17: qty 2

## 2021-11-17 MED ORDER — PRENATAL MULTIVITAMIN CH
1.0000 | ORAL_TABLET | Freq: Every day | ORAL | Status: DC
  Administered 2021-11-18 – 2021-11-20 (×3): 1 via ORAL
  Filled 2021-11-17 (×3): qty 1

## 2021-11-17 MED ORDER — PHENYLEPHRINE 40 MCG/ML (10ML) SYRINGE FOR IV PUSH (FOR BLOOD PRESSURE SUPPORT)
PREFILLED_SYRINGE | INTRAVENOUS | Status: AC
  Filled 2021-11-17: qty 10

## 2021-11-17 MED ORDER — SODIUM BICARBONATE 8.4 % IV SOLN: INTRAVENOUS | Status: DC | PRN

## 2021-11-17 MED ORDER — DIPHENHYDRAMINE HCL 25 MG PO CAPS: 25.0000 mg | ORAL_CAPSULE | Freq: Four times a day (QID) | ORAL | Status: DC | PRN

## 2021-11-17 MED ORDER — OXYCODONE HCL 5 MG PO TABS
5.0000 mg | ORAL_TABLET | ORAL | Status: DC | PRN
  Administered 2021-11-18 – 2021-11-19 (×2): 5 mg via ORAL
  Administered 2021-11-20: 10 mg via ORAL
  Filled 2021-11-17 (×2): qty 1
  Filled 2021-11-17: qty 2

## 2021-11-17 MED ORDER — FENTANYL CITRATE (PF) 100 MCG/2ML IJ SOLN: 100.0000 ug | Freq: Once | INTRAMUSCULAR | Status: DC

## 2021-11-17 MED ORDER — FENTANYL CITRATE (PF) 100 MCG/2ML IJ SOLN
100.0000 ug | Freq: Once | INTRAMUSCULAR | Status: AC
  Administered 2021-11-17 (×4): 50 ug via EPIDURAL
  Filled 2021-11-17: qty 2

## 2021-11-17 MED ORDER — OXYTOCIN-SODIUM CHLORIDE 30-0.9 UT/500ML-% IV SOLN
1.0000 m[IU]/min | INTRAVENOUS | Status: DC
  Administered 2021-11-17: 2 m[IU]/min via INTRAVENOUS

## 2021-11-17 MED ORDER — PROMETHAZINE HCL 25 MG/ML IJ SOLN: 6.2500 mg | INTRAMUSCULAR | Status: DC | PRN

## 2021-11-17 MED ORDER — MEASLES, MUMPS & RUBELLA VAC IJ SOLR: 0.5000 mL | Freq: Once | INTRAMUSCULAR | Status: DC

## 2021-11-17 MED ORDER — SOD CITRATE-CITRIC ACID 500-334 MG/5ML PO SOLN: 30.0000 mL | ORAL | Status: DC

## 2021-11-17 MED ORDER — ENOXAPARIN SODIUM 40 MG/0.4ML IJ SOSY
40.0000 mg | PREFILLED_SYRINGE | INTRAMUSCULAR | Status: DC
  Administered 2021-11-18 – 2021-11-20 (×3): 40 mg via SUBCUTANEOUS
  Filled 2021-11-17 (×3): qty 0.4

## 2021-11-17 MED ORDER — MEDROXYPROGESTERONE ACETATE 150 MG/ML IM SUSP: 150.0000 mg | INTRAMUSCULAR | Status: DC | PRN

## 2021-11-17 MED ORDER — ACETAMINOPHEN 10 MG/ML IV SOLN: 1000.0000 mg | Freq: Once | INTRAVENOUS | Status: DC | PRN

## 2021-11-17 MED ORDER — TRANEXAMIC ACID-NACL 1000-0.7 MG/100ML-% IV SOLN
INTRAVENOUS | Status: AC
  Filled 2021-11-17: qty 100

## 2021-11-17 MED ORDER — METOCLOPRAMIDE HCL 5 MG/ML IJ SOLN
INTRAMUSCULAR | Status: DC | PRN
  Administered 2021-11-17: 10 mg via INTRAVENOUS

## 2021-11-17 MED ORDER — SCOPOLAMINE 1 MG/3DAYS TD PT72: 1.0000 | MEDICATED_PATCH | Freq: Once | TRANSDERMAL | Status: DC

## 2021-11-17 MED ORDER — ACETAMINOPHEN 325 MG PO TABS: 325.0000 mg | ORAL_TABLET | ORAL | Status: DC | PRN

## 2021-11-17 SURGICAL SUPPLY — 40 items
ADH SKN CLS APL DERMABOND .7 (GAUZE/BANDAGES/DRESSINGS)
APL SKNCLS STERI-STRIP NONHPOA (GAUZE/BANDAGES/DRESSINGS) ×1
BENZOIN TINCTURE PRP APPL 2/3 (GAUZE/BANDAGES/DRESSINGS) ×2 IMPLANT
CHLORAPREP W/TINT 26ML (MISCELLANEOUS) ×2 IMPLANT
CLAMP CORD UMBIL (MISCELLANEOUS) IMPLANT
CLOTH BEACON ORANGE TIMEOUT ST (SAFETY) ×2 IMPLANT
CLSR STERI-STRIP ANTIMIC 1/2X4 (GAUZE/BANDAGES/DRESSINGS) ×1 IMPLANT
DERMABOND ADVANCED (GAUZE/BANDAGES/DRESSINGS)
DERMABOND ADVANCED .7 DNX12 (GAUZE/BANDAGES/DRESSINGS) IMPLANT
DRSG OPSITE POSTOP 4X10 (GAUZE/BANDAGES/DRESSINGS) ×2 IMPLANT
ELECT REM PT RETURN 9FT ADLT (ELECTROSURGICAL) ×2
ELECTRODE REM PT RTRN 9FT ADLT (ELECTROSURGICAL) ×1 IMPLANT
EXTRACTOR VACUUM KIWI (MISCELLANEOUS) IMPLANT
GLOVE BIOGEL PI IND STRL 7.0 (GLOVE) ×3 IMPLANT
GLOVE BIOGEL PI INDICATOR 7.0 (GLOVE) ×3
GLOVE ECLIPSE 6.5 STRL STRAW (GLOVE) ×2 IMPLANT
GOWN STRL REUS W/TWL LRG LVL3 (GOWN DISPOSABLE) ×6 IMPLANT
HEMOSTAT ARISTA ABSORB 3G PWDR (HEMOSTASIS) ×1 IMPLANT
KIT ABG SYR 3ML LUER SLIP (SYRINGE) IMPLANT
NDL HYPO 25X5/8 SAFETYGLIDE (NEEDLE) IMPLANT
NEEDLE HYPO 25X5/8 SAFETYGLIDE (NEEDLE) IMPLANT
NS IRRIG 1000ML POUR BTL (IV SOLUTION) ×2 IMPLANT
PACK C SECTION WH (CUSTOM PROCEDURE TRAY) ×2 IMPLANT
PAD ABD 7.5X8 STRL (GAUZE/BANDAGES/DRESSINGS) ×2 IMPLANT
PAD OB MATERNITY 4.3X12.25 (PERSONAL CARE ITEMS) ×2 IMPLANT
PENCIL SMOKE EVAC W/HOLSTER (ELECTROSURGICAL) ×2 IMPLANT
RTRCTR C-SECT PINK 25CM LRG (MISCELLANEOUS) ×2 IMPLANT
STRIP CLOSURE SKIN 1/2X4 (GAUZE/BANDAGES/DRESSINGS) ×2 IMPLANT
SUT PLAIN 0 NONE (SUTURE) IMPLANT
SUT PLAIN 2 0 XLH (SUTURE) IMPLANT
SUT VIC AB 0 CT1 27 (SUTURE) ×4
SUT VIC AB 0 CT1 27XBRD ANBCTR (SUTURE) ×2 IMPLANT
SUT VIC AB 0 CTX 36 (SUTURE) ×6
SUT VIC AB 0 CTX36XBRD ANBCTRL (SUTURE) ×3 IMPLANT
SUT VIC AB 2-0 CT1 27 (SUTURE) ×2
SUT VIC AB 2-0 CT1 TAPERPNT 27 (SUTURE) ×1 IMPLANT
SUT VIC AB 4-0 KS 27 (SUTURE) ×2 IMPLANT
TOWEL OR 17X24 6PK STRL BLUE (TOWEL DISPOSABLE) ×2 IMPLANT
TRAY FOLEY W/BAG SLVR 14FR LF (SET/KITS/TRAYS/PACK) IMPLANT
WATER STERILE IRR 1000ML POUR (IV SOLUTION) ×2 IMPLANT

## 2021-11-17 NOTE — Anesthesia Postprocedure Evaluation (Signed)
Anesthesia Post Note  Patient: QUANITA BARONA  Procedure(s) Performed: CESAREAN SECTION     Patient location during evaluation: PACU Anesthesia Type: Spinal Level of consciousness: oriented and awake and alert Pain management: pain level controlled Vital Signs Assessment: post-procedure vital signs reviewed and stable Respiratory status: spontaneous breathing, respiratory function stable and patient connected to nasal cannula oxygen Cardiovascular status: blood pressure returned to baseline and stable Postop Assessment: no headache, no backache and no apparent nausea or vomiting Anesthetic complications: no   No notable events documented.  Last Vitals:  Vitals:   11/17/21 2118 11/17/21 2253  BP: (!) 102/59 112/71  Pulse: 79 78  Resp: 20   Temp: 36.8 C 36.7 C  SpO2: 96% 99%    Last Pain:  Vitals:   11/17/21 2127  TempSrc:   PainSc: 0-No pain   Pain Goal: Patients Stated Pain Goal: 2 (11/17/21 0829)                 Shelton Silvas

## 2021-11-17 NOTE — Progress Notes (Signed)
Labor Progress Note Jill [name] is a 25 y.o. G1P0000 at [redacted]w[redacted]d presented for SOL.  S: feeling back pain despite epidural. Uncomfortable. On hands and knees when I entered. Pain is located on   O:  BP 133/69    Pulse 89    Temp 98.3 F (36.8 C) (Oral)    Resp 18    Ht 5\' 2"  (1.575 m)    Wt 90.6 kg    LMP 02/03/2021    BMI 36.54 kg/m  EFM: 145/mod/+accels. No decel  CVE: Dilation: 8 Effacement (%): 100 Station: 0 Presentation: Vertex Exam by:: Dr. [email or web address]   A&P: 25 y.o. G1P0000 [redacted]w[redacted]d SOL #Labor: Progressing slowly. Infant feels OP on exam with asymmetric dilation. Recommended peanut ball and pitocin.  Patient accept pitocin intervention. Start Pitocin 2x2. Also urine is concentrated- will give 500cc bolus. #Pain: Epidural in place. Redosed #3 while I was in the room. If this does not work to help with pain, I plan to ask for the epidural to be replaced.   #FWB: Cat 1 #GBS negative  [redacted]w[redacted]d, MD 12:56 PM

## 2021-11-17 NOTE — Progress Notes (Signed)
RN pulled out fentanyl under IV fentanyl order at 0908 for Dr. Hart Rochester to give via epidural. RN called pharmacy around 1020 to clarify if that was okay. Per pharmacy-RN to "return IV dose and pull it under epidural dose in pyxis". This RN completed the return in pyxis as advised. Dr. Hart Rochester notified and he charted under correct order.

## 2021-11-17 NOTE — Anesthesia Procedure Notes (Signed)
Spinal  Start time: 11/17/2021 6:52 PM End time: 11/17/2021 6:55 PM Reason for block: surgical anesthesia Staffing Performed: anesthesiologist  Anesthesiologist: Shelton Silvas, MD Preanesthetic Checklist Completed: patient identified, IV checked, site marked, risks and benefits discussed, surgical consent, monitors and equipment checked, pre-op evaluation and timeout performed Spinal Block Patient position: sitting Prep: DuraPrep and site prepped and draped Location: L3-4 Injection technique: single-shot Needle Needle type: Pencan  Needle gauge: 24 G Needle length: 10 cm Needle insertion depth: 10 cm Additional Notes Patient tolerated well. No immediate complications.  Functioning IV was confirmed and monitors were applied. Sterile prep and drape, including hand hygiene and sterile gloves were used. The patient was positioned and the back was prepped. The skin was anesthetized with lidocaine. Free flow of clear CSF was obtained prior to injecting local anesthetic into the CSF. The spinal needle aspirated freely following injection. The needle was carefully withdrawn. The patient tolerated the procedure well.

## 2021-11-17 NOTE — Progress Notes (Signed)
Patient ID: SHEREECE WELLBORN, female   DOB: Dec 20, 1995, 25 y.o.   MRN: [id number]  Labor course update received from Selena Batten, [place]. Anesthesia at bedside re-dosing epidural. RN will contact CNM when patient is comfortable and ready for AROM.  Clayton Bibles, MSA, MSN, CNM Certified Nurse Midwife, [facility] for [employer], [facility] 11/17/21 4:45 AM

## 2021-11-17 NOTE — Op Note (Signed)
PreOp Diagnosis: 1) Intrauterine pregnancy @ [redacted]w[redacted]d -Arrest of descent  PostOp Diagnosis: same Procedure: Primary C-section Surgeon: Dr. Myna Hidalgo Anesthesia: spinal Complications: none EBL: 1000cc UOP: 100cc Fluids: 1000cc  Findings: Female infant-OP presentation, normal uterus, tubes and ovaries bilaterally  PROCEDURE:  Informed consent was obtained from the patient with risks, benefits, complications, treatment options, and expected outcomes discussed with the patient.  The patient concurred with the proposed plan, giving informed consent with form signed.   The patient was taken to Operating Room, and identified with the procedure verified as C-Section Delivery with Time Out. With induction of anesthesia, the patient was prepped and draped in the usual sterile fashion. A Pfannenstiel incision was made and carried down through the subcutaneous tissue to the fascia. The fascia was incised in the midline and extended transversely. The superior aspect of the fascial incision was grasped with Kochers elevated and the underlying muscle dissected off. The inferior aspect of the facial incision was in similar fashion, grasped elevated and rectus muscles dissected off. The peritoneum was identified and entered. Peritoneal incision was extended longitudinally. Alexis retractor was placed.  The utero-vesical peritoneal reflection was identified and created bluntly with slight traction.  A low transverse uterine incision was made and the infants head delivered atraumatically with vaginal assistance. After the umbilical cord was clamped and cut cord blood was obtained for evaluation.   The placenta was removed intact and appeared normal. The uterine outline, tubes and ovaries appeared normal. The uterine incision was closed with running locked sutures of 0 Vicryl and a second layer of the same stitch was used in an imbricating fashion.  There was a small extension with bleeding noted on the right side  of the hysterotomy repaired with 0-vicryl in a running fashion.  Excellent hemostasis was obtained. Arista was placed.  Alexis retractor was removed.  The peritoneum was closed in a running fashion. The fascia was then reapproximated with running sutures of 0 Vicryl. The subcutaneous tissue was reapproximated with 2-0 plain gut suture.   The skin was closed with 4-0 vicryl in a subcuticular fashion.  Instrument, sponge, and needle counts were correct prior the abdominal closure and at the conclusion of the case. The patient was taken to recovery in stable condition.   Myna Hidalgo, DO Attending Obstetrician & Gynecologist, [facility] for [employer], [facility]

## 2021-11-17 NOTE — Progress Notes (Signed)
Mildred B Petruzzi is a 25 y.o. G1P0000 at [redacted]w[redacted]d   Subjective: Now comfortable with epidural. Denies pain  Objective: BP 122/71    Pulse 81    Temp 98 F (36.7 C)    Resp 17    Ht 5\' 2"  (1.575 m)    Wt 90.6 kg    LMP 02/03/2021    BMI 36.54 kg/m  No intake/output data recorded. No intake/output data recorded.  FHT:  FHR: 135 bpm, variability: moderate,  accelerations:  Present,  decelerations:  Absent UC:   irregular, every 3-6 minutes SVE:   Dilation: 7 Effacement (%): 100 Station: 0 Exam by:: K.Louis-Charles, RN  Labs: Lab Results  Component Value Date   WBC 11.6 (H) 11/16/2021   HGB 11.7 (L) 11/16/2021   HCT 36.5 11/16/2021   MCV 87.1 11/16/2021   PLT 204 11/16/2021    Assessment / Plan: --25 y.o. G1P0000 at [redacted]w[redacted]d  --Cat I tracing --GBS neg --Now comfortable with epidural --7cm/90/0 with BBOW --AROM clear fluid at 0515 --Anticipate vaginal delivery  [redacted]w[redacted]d, CNM 11/17/2021, 5:47 AM

## 2021-11-17 NOTE — Progress Notes (Signed)
At bedside for evaluation of pushing.  Pudendal block given 10cc on each side.  At first initial improvement with pain, but still noted considerable pain in her back.  Pt agreeable to push for one contractions, pt pushed with attempt at rotation of fetal position.  Significant caput noted with minimal rotation noted.  Pt pushed with no descent of fetal movement.  Reviewed management options at this time, pt has been pushing with minimal descent likely due to OP presentation.  Discussed continued management vs C-section due to arrest of descent.  Pt desires to proceed with Primary C-section due to arrest of descent.  FWB- Cat. I  Risk benefits and alternatives of cesarean section were discussed with the patient including but not limited to infection, bleeding, damage to bowel , bladder and baby with the need for further surgery. Pt voiced understanding and desires to proceed.    Jill Hidalgo, DO Attending Obstetrician & Gynecologist, [facility] for [employer], [facility]

## 2021-11-17 NOTE — Transfer of Care (Signed)
Immediate Anesthesia Transfer of Care Note  Patient: Jill [name]  Procedure(s) Performed: CESAREAN SECTION  Patient Location: PACU  Anesthesia Type:Spinal  Level of Consciousness: awake, alert  and oriented  Airway & Oxygen Therapy: Patient Spontanous Breathing  Post-op Assessment: Report given to RN and Post -op Vital signs reviewed and stable  Post vital signs: Reviewed and stable  Last Vitals:  Vitals Value Taken Time  BP    Temp    Pulse 100 11/17/21 1954  Resp 22 11/17/21 1954  SpO2 100 % 11/17/21 1954  Vitals shown include unvalidated device data.  Last Pain:  Vitals:   11/17/21 1832  TempSrc:   PainSc: 10-Worst pain ever      Patients Stated Pain Goal: 2 (11/17/21 0829)  Complications: No notable events documented.

## 2021-11-17 NOTE — Anesthesia Procedure Notes (Signed)
Epidural Patient location during procedure: OB Start time: 11/17/2021 11:33 PM End time: 11/17/2021 11:59 PM  Staffing Anesthesiologist: Lewie Loron, MD Performed: anesthesiologist   Preanesthetic Checklist Completed: patient identified, IV checked, risks and benefits discussed, monitors and equipment checked, pre-op evaluation and timeout performed  Epidural Patient position: sitting Prep: DuraPrep and site prepped and draped Patient monitoring: heart rate, continuous pulse ox and blood pressure Approach: midline Location: L3-L4 Injection technique: LOR air and LOR saline  Needle:  Needle type: Tuohy  Needle gauge: 17 G Needle length: 9 cm Needle insertion depth: 5 cm Catheter type: closed end flexible Catheter size: 19 Gauge Catheter at skin depth: 11 cm Test dose: negative  Assessment Sensory level: T8 Events: blood not aspirated, injection not painful, no injection resistance, no paresthesia and negative IV test  Additional Notes Reason for block:procedure for pain

## 2021-11-17 NOTE — Progress Notes (Signed)
Labor Progress Note Gwyneth B Mcmaster is a 25 y.o. G1P0000 at [redacted]w[redacted]d presented for spontaneous onset of labor  S:  Much more comfortable with epidural  O:  BP 124/78 (BP Location: Right Arm)    Pulse 97    Temp 98 F (36.7 C)    Resp 17    Ht 5\' 2"  (1.575 m)    Wt 90.6 kg    LMP 02/03/2021    BMI 36.54 kg/m   Fetal Tracing:  Baseline: 140 Variability: moderate Accels: 15x15 Decels: none  Toco: 2-5   CVE: Dilation: 5 Effacement (%): 100 Station: 0 Presentation: Vertex Exam by:: [email or web address], CNM   A&P: 25 y.o. G1P0000 [redacted]w[redacted]d spontaneous onset of labor #Labor: Progressing well. Will recheck in 2-3 hours and AROM if unchanged #Pain: epidural #FWB: Cat 1 #GBS negative  [redacted]w[redacted]d, CNM 1:11 AM

## 2021-11-18 ENCOUNTER — Encounter (HOSPITAL_COMMUNITY): Payer: Self-pay | Admitting: Obstetrics & Gynecology

## 2021-11-18 LAB — CBC
HCT: 25.7 % — ABNORMAL LOW (ref 36.0–46.0)
Hemoglobin: 8.6 g/dL — ABNORMAL LOW (ref 12.0–15.0)
MCH: 29.4 pg (ref 26.0–34.0)
MCHC: 33.5 g/dL (ref 30.0–36.0)
MCV: 87.7 fL (ref 80.0–100.0)
Platelets: 177 10*3/uL (ref 150–400)
RBC: 2.93 MIL/uL — ABNORMAL LOW (ref 3.87–5.11)
RDW: 18.6 % — ABNORMAL HIGH (ref 11.5–15.5)
WBC: 16.2 10*3/uL — ABNORMAL HIGH (ref 4.0–10.5)
nRBC: 0 % (ref 0.0–0.2)

## 2021-11-18 LAB — GLUCOSE, CAPILLARY: Glucose-Capillary: 139 mg/dL — ABNORMAL HIGH (ref 70–99)

## 2021-11-18 MED ORDER — MEASLES, MUMPS & RUBELLA VAC IJ SOLR
0.5000 mL | Freq: Once | INTRAMUSCULAR | Status: AC
  Administered 2021-11-20: 0.5 mL via SUBCUTANEOUS
  Filled 2021-11-18: qty 0.5

## 2021-11-18 NOTE — Progress Notes (Signed)
Subjective: Postpartum Day 1: Cesarean Delivery Eating, drinking, ambulating well. Foley out this am, hasn't voided yet. +flatus.  Lochia and pain wnl.  Denies dizziness, lightheadedness, or sob. No complaints.   Objective: Vital signs in last 24 hours: Temp:  [97.8 F (36.6 C)-98.8 F (37.1 C)] 98.2 F (36.8 C) (01/01 0532) Pulse Rate:  [62-194] 78 (01/01 0532) Resp:  [16-33] 18 (01/01 0532) BP: (95-144)/(43-121) 99/53 (01/01 0532) SpO2:  [88 %-100 %] 100 % (01/01 0330)  Physical Exam:  General: alert, cooperative, and no distress Lochia: appropriate Uterine Fundus: firm Incision: healing well, no significant drainage, no dehiscence, no significant erythema DVT Evaluation: No evidence of DVT seen on physical exam. Negative Homan's sign. No cords or calf tenderness. No significant calf/ankle edema.  Recent Labs    11/16/21 2151 11/18/21 0429  HGB 11.7* 8.6*  HCT 36.5 25.7*    Assessment/Plan: Status post Cesarean section. Doing well postoperatively.  Continue current care. Plan d/c tomorrow Fe QOD for anemia, asymptomatic  Cheral Marker 11/18/2021, 7:27 AM

## 2021-11-18 NOTE — Lactation Note (Signed)
This note was copied from a baby's chart. Lactation Consultation Note  Patient Name: Jill [name] HGDJM'[id number] Date: 11/18/2021 Reason for consult: Follow-up assessment;1st time breastfeeding;Primapara;Term Age:24 hours   P1 mother whose infant is now 38 hours old.  This is a term baby at 41+0 weeks.  Mother's current feeding preference is breast.  Mother requested latch assistance.  Baby was not showing feeding cues when I arrived.  Reviewed breast feeding basics and taught hand expression.  Mother able to express colostrum drops which I finger fed back to baby.  Previous LC had initiated a NS; 2 sizes at bedside.  Reviewed correct technique for placement and mother demonstrated.  #20 seems to be the appropriate size at this time.  Assisted baby to latch, however, she pushed back and was not interested in initiating a consistent suck.  Few sucks noted.  Attempted a few more times with the same result.  Placed her STS on mother's chest and she fell asleep.  Reviewed feeding plan for today.  Encouraged lots of STS and hand expression.  Mother will continue to pump and feed back any EBM she obtains to baby.  After the last pumping session she was able to obtain 5 mls of EBM.  Praised mother for her efforts.  Reassurance given.    Father and grandparents present.  Suggested mother call her RN/LC as needed for latch assistance.  RN in room and updated.   Maternal Data Has patient been taught Hand Expression?: Yes Does the patient have breastfeeding experience prior to this delivery?: No  Feeding Mother's Current Feeding Choice: Breast Milk  LATCH Score Latch: Too sleepy or reluctant, no latch achieved, no sucking elicited.  Audible Swallowing: None  Type of Nipple: Flat (Using NS)  Comfort (Breast/Nipple): Soft / non-tender  Hold (Positioning): Assistance needed to correctly position infant at breast and maintain latch.  LATCH Score: 4   Lactation Tools Discussed/Used Tools:  Nipple Dorris Carnes;Shells Nipple shield size: 20;24 Breast pump type: Double-Electric Breast Pump;Manual Reason for Pumping: Nipple shield use Pumping frequency: After every feeding  Interventions Interventions: Breast feeding basics reviewed;Assisted with latch;Skin to skin;Breast massage;Hand express;Breast compression;Adjust position;DEBP;Hand pump;Shells;Expressed milk;Position options;Support pillows;Education  Discharge Pump: DEBP;Manual;Personal (Spectra) WIC Program: No  Consult Status Consult Status: Follow-up Date: 11/19/21 Follow-up type: In-patient    Dora Sims 11/18/2021, 12:58 PM

## 2021-11-18 NOTE — Lactation Note (Signed)
This note was copied from a baby's chart. Lactation Consultation Note Late entry for 11/17/21 2300 Mom unable to latch to breast d/t flat non-compressible breast d/t edema. Reverse pressure not helpful enough. Mom stated she has been leaking colostrum. Mom stated baby BF after delivery. Noted bruising to Lt. Areola away from nipple. Shells given to wear in am. Mom shown how to use DEBP & how to disassemble, clean, & reassemble parts. Mom knows to pump q3h for 15-20 min.  Mom encouraged to feed baby 8-12 times/24 hours and with feeding cues.    Fitted mom w/#24 NS. Latched baby and baby BF well. Noted colostrum in NS after feeding. Praised mom. Mom will not be able to latch to breast until edema subsides.  Mom very tired after feeding. Praised mom for good feeding. Feeding positions, support, safety while feeding, STS, I&O, feeding habits and newborn behavior reviewed. Lactation brochure given.  Patient Name: Girl Nashayla Telleria OIZTI'W Date: 11/18/2021   Age:24 hours  Maternal Data    Feeding    LATCH Score                    Lactation Tools Discussed/Used    Interventions    Discharge    Consult Status      Charyl Dancer 11/18/2021, 3:34 AM

## 2021-11-18 NOTE — Discharge Summary (Signed)
Postpartum Discharge Summary  Date of Service updated-yes     Patient Name: Jill [name] DOB: 07-Sep-1996 MRN: [id number]  Date of admission: 11/16/2021 Delivery date:11/17/2021  Delivering provider: Janyth Pupa  Date of discharge: 11/19/2021  Admitting diagnosis: Normal labor [O80, Z37.9] Intrauterine pregnancy: [redacted]w[redacted]d    Secondary diagnosis:  Principal Problem: Intrauterine pregnancy @ 433w0drrest of descent  Additional problems: Cesarean delivery    Discharge diagnosis: Term Pregnancy Delivered                                              Post partum procedures: none Augmentation: AROM and Pitocin Complications: None  Hospital course: Onset of Labor With Unplanned C/Active LaborS   [age].o. yo G1P1001 at 415w0ds admitted in Active Labor on 11/16/2021. Patient had a labor course significant for arrest of descent.  She progressed to complete- however due to discomfort and difficulty with pushing, she never progressed past 0 station. The patient went for cesarean section due to Arrest of Descent. Delivery details as follows: Membrane Rupture Time/Date: 5:20 AM ,11/17/2021   Delivery Method:C-Section, Low Transverse  Details of operation can be found in separate operative note. Patient had an uncomplicated postpartum course.  She is ambulating,tolerating a regular diet, passing flatus, and urinating well.  Patient is discharged home in stable condition 11/19/21.  Newborn Data: Birth date:11/17/2021  Birth time:7:09 PM  Gender:Female  Living status:Living  Apgars:8 ,9  Weight:3915 g   Magnesium Sulfate received: No BMZ received: No Rhophylac:No MMR:No T-DaP:Given prenatally Flu: Yes Transfusion:No  Physical exam  Vitals:   11/18/21 0932 11/18/21 1402 11/18/21 2214 11/19/21 0623  BP: (!) 107/55 (!) 106/57 109/61 100/61  Pulse: 90 95 (!) 103 98  Resp: _0 Temp: 98.2 F (36.8 C) 98.1 F (36.7 C) 98.3 F (36.8 C) 98.3 F (36.8 C)  TempSrc:  Axillary Oral Oral Oral  SpO2: 100% 98% 99% 98%  Weight:      Height:       General: alert, cooperative, and no distress Lochia: appropriate Uterine Fundus: firm Incision: Healing well with no significant drainage, No significant erythema, N/A; dressing intact with old brown drainage DVT Evaluation: No evidence of DVT seen on physical exam. Negative Homan's sign. No cords or calf tenderness. No significant calf/ankle edema. Labs: Lab Results  Component Value Date   WBC 16.2 (H) 11/18/2021   HGB 8.6 (L) 11/18/2021   HCT 25.7 (L) 11/18/2021   MCV 87.7 11/18/2021   PLT 177 11/18/2021   CMP Latest Ref Rng & Units 10/22/2021  Glucose 70 - 99 mg/dL 72  BUN 6 - 20 mg/dL <5(L)  Creatinine 0.44 - 1.00 mg/dL 0.71  Sodium 135 - 145 mmol/L 135  Potassium 3.5 - 5.1 mmol/L 3.1(L)  Chloride 98 - 111 mmol/L 106  CO2 22 - 32 mmol/L 20(L)  Calcium 8.9 - 10.3 mg/dL 8.3(L)  Total Protein 6.5 - 8.1 g/dL 5.2(L)  Total Bilirubin 0.3 - 1.2 mg/dL 1.1  Alkaline Phos 38 - 126 U/L 97  AST 15 - 41 U/L 18  ALT 0 - 44 U/L 13   Edinburgh Score: No flowsheet data found.   After visit meds:  Allergies as of 11/19/2021   No Known Allergies      Medication List     STOP taking these medications  cyclobenzaprine 10 MG tablet Commonly known as: FLEXERIL   ondansetron 4 MG disintegrating tablet Commonly known as: ZOFRAN-ODT   promethazine 25 MG tablet Commonly known as: PHENERGAN   pseudoephedrine 30 MG tablet Commonly known as: SUDAFED       TAKE these medications    Flintstones Complete 10 MG Chew Chew 2 tablets by mouth daily.   ibuprofen 600 MG tablet Commonly known as: ADVIL Take 1 tablet (600 mg total) by mouth every 6 (six) hours.   oxyCODONE 5 MG immediate release tablet Commonly known as: Oxy IR/ROXICODONE Take 1-2 tablets (5-10 mg total) by mouth every 4 (four) hours as needed for moderate pain.         Discharge home in stable condition Infant Feeding:  Breast Infant Disposition:home with mother Discharge instruction: per After Visit Summary and Postpartum booklet. Activity: Advance as tolerated. Pelvic rest for 6 weeks.  Diet: routine diet Future Appointments:No future appointments. Follow up Visit:  Follow-up Information     [facility]. Go in 1 week(s).   Specialty: Obstetrics and Gynecology Contact information: [address] [place] [phone]        [facility] .   Specialty: Obstetrics and Gynecology Contact information: [address] [place] [phone]               Please schedule this patient for a In person postpartum visit in 1 week with the following provider: Any provider. Additional Postpartum F/U:Incision check 1 week  Low risk pregnancy complicated by:  n/a Delivery mode:  C-Section, Low Transverse  Anticipated Birth Control:  Unsure   11/19/2021 Julianne Handler, CNM  Attestation of Attending Supervision of Advanced Practice Provider (PA/CNM/NP): Evaluation and management procedures were performed by the Advanced Practice Provider under my supervision and collaboration.  I have reviewed the Advanced Practice Provider's note and chart, and I agree with the management and plan. I have also made any necessary editorial changes.   Annalee Genta, DO Attending [place], [facility], [employer] 11/19/2021 5:14 PM

## 2021-11-19 ENCOUNTER — Inpatient Hospital Stay (HOSPITAL_COMMUNITY): Payer: BC Managed Care – PPO

## 2021-11-19 MED ORDER — IBUPROFEN 600 MG PO TABS: 600.0000 mg | ORAL_TABLET | Freq: Four times a day (QID) | ORAL | 0 refills | Status: DC

## 2021-11-19 MED ORDER — OXYCODONE HCL 5 MG PO TABS: 5.0000 mg | ORAL_TABLET | ORAL | 0 refills | Status: DC | PRN

## 2021-11-19 NOTE — Lactation Note (Signed)
This note was copied from a baby's chart. Lactation Consultation Note  Patient Name: Girl Lynann Corro [id number] Date: 11/19/2021   Age:24 hours  Lactation attempted to consult with Mom, but she is showering.  Will F/U later today.  Mom is pumping and supplementing baby after breastfeeding currently due to 8.6% weight loss.  Broadus John 11/19/2021, 9:16 AM

## 2021-11-19 NOTE — Lactation Note (Signed)
This note was copied from a baby's chart. Lactation Consultation Note  Patient Name: Jill [name] [id number] Date: 11/19/2021 Reason for consult: Follow-up assessment;Primapara;1st time breastfeeding;Infant weight loss;Term;Difficult latch Age:24 hours  LC in to visit with P1 Mom of term baby at 8.6% weight loss.  Mom has been using a 20 mm nipple shield to help with latch due to short nipples.  Mom is excited that baby is latching and feeding better, hearing swallows during feeding.    Mom is pumping after breastfeeding and expressed 12 ml last pumping.  Mom fed this to baby.  Encouraged STS and breastfeeding with cues.  Mom to pump after breastfeeding and offer EBM by paced bottle, adding donor breast milk prn.    Mom to ask for help prn.    Lactation Tools Discussed/Used Tools: Pump;Flanges;Nipple [place] Fuel;Bottle Nipple shield size: 20 Flange Size: 24 Breast pump type: Double-Electric Breast Pump;Manual Pumping frequency: After each feeding Pumped volume: 12 mL  Interventions Interventions: Breast feeding basics reviewed;Skin to skin;Breast massage;Hand express;Pre-pump if needed;DEBP;Pace feeding   Consult Status Consult Status: Follow-up Date: 11/20/21 Follow-up type: In-patient    Broadus John 11/19/2021, 1:34 PM

## 2021-11-19 NOTE — Progress Notes (Signed)
Pt requesting to cancel discharge since infant infant will remain admitted.

## 2021-11-20 NOTE — Lactation Note (Signed)
This note was copied from a baby's chart. Lactation Consultation Note RN asked LC to see mom. Mom is just pumping and bottle feeding. Mom states baby doesn't like the NS. Mom is unable to latch w/o NS. Mom is pumping giving her colostrum to baby first then supplementing w/DM. Baby is getting around 22 ml. Reviewed bottle feeding and not going to the breast the baby needs 30-60 ml. Encouraged to give at least 30 ml for next several feedings then increase as needed. Baby had 9% wt. Loss. Mom states understanding.  Patient Name: Jill [name] [id number] Date: 11/20/2021 Reason for consult: Follow-up assessment;Term Age:26 hours  Maternal Data    Feeding Mother's Current Feeding Choice: Breast Milk and Donor Milk  LATCH Score       Type of Nipple: Flat            Lactation Tools Discussed/Used Tools: Pump Breast pump type: Double-Electric Breast Pump  Interventions    Discharge    Consult Status Consult Status: Follow-up Date: 11/20/21 Follow-up type: In-patient    Charyl Dancer 11/20/2021, 3:43 AM

## 2021-11-20 NOTE — Lactation Note (Addendum)
This note was copied from a baby's chart. Lactation Consultation Note  Patient Name: Jill [name] [id number] Date: 11/20/2021 Reason for consult: Follow-up assessment;Mother's request;Difficult latch;Term;Breastfeeding assistance Age:24 hours  Infant not able to latch during the visit, parents working on feeding 30 min prior to [facility] arrival.   Mom pumped first before latching. LC went over importance of latching first followed by supplementing and pumping. Infant not void or stool since 4 am. BF supplementation guide reviewed. Parents work on offering more volume as infant not latching.  Initial report infant 10 % weight loss. Reweigh this afternoon, show 45 gram increase from the day before.  Plan 1. To feed based on cues 8-12x 24hr period. Mom wearing breast shells or using hand pump prior to latching.  2. Mom to latch with help of 24 NS offering more volume with 5 french and syringe to initiate suck, monitor feeding at breast with signs of milk transfer.  3. Dad to supplement EBM followed by [facility] pace bottle feeding and slow flow nipple 30-60 ml increase as tolerated.  4. DEBP q 3hrs for 15 min  All questions answered at the end of the visit.   Mom to call for latch assistance at start of feeding infant most active to work on latching with help of 24 NS.  Maternal Data    Feeding Mother's Current Feeding Choice: Breast Milk and Donor Milk  LATCH Score                    Lactation Tools Discussed/Used Tools: Pump;Flanges;Shells;Nipple Shields;Coconut oil;63F feeding tube / Syringe (Infant 20 ml feeding prior to [facility] arrival. LC worked with parents to latch using 24 NS primed with EBM using 5 french. Infant not engaged to latch even with pushing volume. Mom fed additional 20 ml. Will start next feeding with 5 french/NS infant is alert) Nipple shield size: 24 Flange Size: 27 Breast pump type: Double-Electric Breast Pump Pump Education: Setup, frequency, and cleaning;Milk  Storage Reason for Pumping: increase stimulation Pumping frequency: every 3 hrs for 15 min  Interventions Interventions: Breast feeding basics reviewed;Assisted with latch;Skin to skin;Breast massage;Pre-pump if needed;Breast compression;Adjust position;Support pillows;Position options;Coconut oil;Shells;DEBP;Education;Pace feeding;Visual merchandiser education  Discharge    Consult Status Consult Status: Follow-up Date: 11/21/21 Follow-up type: In-patient    Jill [name]  Jill [name] 11/20/2021, 7:13 PM

## 2021-11-20 NOTE — Discharge Summary (Signed)
Postpartum Discharge Summary  Date of Service updated     Patient Name: Jill [name] DOB: Jan 11, 1996 MRN: [id number]  Date of admission: 11/16/2021 Delivery date:11/17/2021  Delivering provider: Janyth Pupa  Date of discharge: 11/20/2021  Admitting diagnosis: Normal labor [O80, Z37.9] Intrauterine pregnancy: [redacted]w[redacted]d    Secondary diagnosis:  Principal Problem:   Normal labor Active Problems:   Cesarean delivery delivered  Additional problems: Cesarean delivery                              Discharge diagnosis: Term Pregnancy Delivered                                              Post partum procedures: none Augmentation: AROM and Pitocin Complications: None   Hospital course: Onset of Labor With Unplanned C/Active LaborS   26y.o. yo G1P1001 at 461w0das admitted in Active Labor on 11/16/2021. Patient had a labor course significant for arrest of descent.  She progressed to complete- however due to discomfort and difficulty with pushing, she never progressed past 0 station. The patient went for cesarean section due to Arrest of Descent. Delivery details as follows: Membrane Rupture Time/Date: 5:20 AM ,11/17/2021   Delivery Method:C-Section, Low Transverse  Details of operation can be found in separate operative note. Patient had an uncomplicated postpartum course.  She is ambulating,tolerating a regular diet, passing flatus, and urinating well.  Patient is discharged home in stable condition 11/19/21.   Newborn Data: Birth date:11/17/2021  Birth time:7:09 PM  Gender:Female  Living status:Living  Apgars:8 ,9  Weight:3915 g    Magnesium Sulfate received: No BMZ received: No Rhophylac:No MMR:No T-DaP:Given prenatally Flu: Yes Transfusion:No  Physical exam  Vitals:   11/18/21 2214 11/19/21 0623 11/19/21 1441 11/20/21 0516  BP: 109/61 100/61 110/64 115/71  Pulse: (!) 103 98 (!) 105 96  Resp: 18 16 17    Temp: 98.3 F (36.8 C) 98.3 F (36.8 C) 98.1 F (36.7 C)    TempSrc: Oral Oral Oral   SpO2: 99% 98% 99%   Weight:      Height:       General: alert, cooperative, and no distress Lochia: appropriate Uterine Fundus: firm Incision: No significant erythema, Dressing is clean, dry, and intact DVT Evaluation: No significant calf/ankle edema. Labs: Lab Results  Component Value Date   WBC 16.2 (H) 11/18/2021   HGB 8.6 (L) 11/18/2021   HCT 25.7 (L) 11/18/2021   MCV 87.7 11/18/2021   PLT 177 11/18/2021   CMP Latest Ref Rng & Units 10/22/2021  Glucose 70 - 99 mg/dL 72  BUN 6 - 20 mg/dL <5(L)  Creatinine 0.44 - 1.00 mg/dL 0.71  Sodium 135 - 145 mmol/L 135  Potassium 3.5 - 5.1 mmol/L 3.1(L)  Chloride 98 - 111 mmol/L 106  CO2 22 - 32 mmol/L 20(L)  Calcium 8.9 - 10.3 mg/dL 8.3(L)  Total Protein 6.5 - 8.1 g/dL 5.2(L)  Total Bilirubin 0.3 - 1.2 mg/dL 1.1  Alkaline Phos 38 - 126 U/L 97  AST 15 - 41 U/L 18  ALT 0 - 44 U/L 13   Edinburgh Score: Edinburgh Postnatal Depression Scale Screening Tool 11/19/2021  I have been able to laugh and see the funny side of things. 0  I have looked forward with enjoyment to things.  0  I have blamed myself unnecessarily when things went wrong. 1  I have been anxious or worried for no good reason. 0  I have felt scared or panicky for no good reason. 0  Things have been getting on top of me. 1  I have been so unhappy that I have had difficulty sleeping. 0  I have felt sad or miserable. 0  I have been so unhappy that I have been crying. 1  The thought of harming myself has occurred to me. 0  Edinburgh Postnatal Depression Scale Total 3     After visit meds:  Allergies as of 11/20/2021   No Known Allergies      Medication List     STOP taking these medications    cyclobenzaprine 10 MG tablet Commonly known as: FLEXERIL   ondansetron 4 MG disintegrating tablet Commonly known as: ZOFRAN-ODT   promethazine 25 MG tablet Commonly known as: PHENERGAN   pseudoephedrine 30 MG tablet Commonly known as:  SUDAFED       TAKE these medications    Flintstones Complete 10 MG Chew Chew 2 tablets by mouth daily.   ibuprofen 600 MG tablet Commonly known as: ADVIL Take 1 tablet (600 mg total) by mouth every 6 (six) hours.   oxyCODONE 5 MG immediate release tablet Commonly known as: Oxy IR/ROXICODONE Take 1-2 tablets (5-10 mg total) by mouth every 4 (four) hours as needed for moderate pain.         Discharge home in stable condition Infant Feeding: Breast Infant Disposition:home with mother Discharge instruction: per After Visit Summary and Postpartum booklet. Activity: Advance as tolerated. Pelvic rest for 6 weeks.  Diet: routine diet Future Appointments:No future appointments. Follow up Visit:  Follow-up Information     [facility]. Go in 1 week(s).   Specialty: Obstetrics and Gynecology Contact information: [address] [place] [phone]        [facility] .   Specialty: Obstetrics and Gynecology Contact information: [address] [place] [phone]                 Please schedule this patient for a In person postpartum visit in 1 week with the following provider: Any provider. Additional Postpartum F/U:Incision check 1 week  Low risk pregnancy complicated by:  None Delivery mode:  C-Section, Low Transverse  Anticipated Birth Control:  Unsure   11/20/2021 Patriciaann Clan, DO

## 2021-11-21 ENCOUNTER — Encounter: Payer: BC Managed Care – PPO | Admitting: Advanced Practice Midwife

## 2021-11-21 ENCOUNTER — Ambulatory Visit: Payer: Self-pay

## 2021-11-21 NOTE — Lactation Note (Signed)
This note was copied from a baby's chart. Lactation Consultation Note  Patient Name: Jill [name] [id number] Date: 11/21/2021 Reason for consult: Follow-up assessment;Primapara;1st time breastfeeding;Term;Breastfeeding assistance Age:24 days  Lactation conducted a discharge visit with Ms. Jill [name]. We reviewed her feeding plan, and I assisted with latching baby Jill [name] to the left breast in football hold. Edema on the areolar tissue noted and discussed. I showed her how to roll up a towel to support the breast for baby's latch. Baby latched to this side using a NS, but noted that her lips were pursed. We discussed trying the size 24 next time to see if baby would open wider.  I observed her breast feed and noted transitional milk in the NS upon release. I also noted EBM at the bedside. Ms. Jill [name] milk is coming in, and we discussed engorgement prevention and care. Baby became sleepy after several minutes, and I showed her how to pester baby at the breast to encourage activity. I also encouraged switch nursing. We reviewed her feeding plan for discharge and discussed the benefits of an OP lactation consult. She was interested and agreed to a referral.  Ms. Jill [name] will follow up with her pediatrician in [place] on 1/5.  Plan: Breast feed baby on demand 8-12 times a day. Offer both breasts in a feeding. Continue to supplement and post pump to support milk production and adjust supplementation volumes contingent on baby's weight and progress at the follow up pediatrician's visit. Follow up with OP lactation consult for a weighted feeding to better inform and support breast feeding.   Maternal Data Has patient been taught Hand Expression?: Yes Does the patient have breastfeeding experience prior to this delivery?: No  Feeding Mother's Current Feeding Choice: Breast Milk  LATCH Score Latch: Grasps breast easily, tongue down, lips flanged, rhythmical sucking.  Audible Swallowing: Spontaneous and  intermittent  Type of Nipple: Flat  Comfort (Breast/Nipple): Soft / non-tender  Hold (Positioning): Assistance needed to correctly position infant at breast and maintain latch.  LATCH Score: 8   Lactation Tools Discussed/Used Tools: Nipple Shields Nipple shield size: 20 (used a 20, but baby may feed better with a 24) Breast pump type: Double-Electric Breast Pump Pump Education: Setup, frequency, and cleaning Reason for Pumping: stimulation and supplementation Pumping frequency: rec post pumping as needed Pumped volume: 30 mL (30+ mls noted)  Discharge Discharge Education: Engorgement and breast care;Outpatient recommendation;Warning signs for feeding baby;Outpatient Epic message sent Pump: Personal  Consult Status Consult Status: Complete Date: 11/21/21 Follow-up type: Call as needed    Walker Shadow 11/21/2021, 12:03 PM

## 2021-11-22 ENCOUNTER — Encounter: Payer: BC Managed Care – PPO | Admitting: Obstetrics & Gynecology

## 2021-11-25 ENCOUNTER — Encounter: Payer: Self-pay | Admitting: Obstetrics & Gynecology

## 2021-11-27 ENCOUNTER — Ambulatory Visit (INDEPENDENT_AMBULATORY_CARE_PROVIDER_SITE_OTHER): Payer: BC Managed Care – PPO | Admitting: Obstetrics & Gynecology

## 2021-11-27 ENCOUNTER — Other Ambulatory Visit: Payer: Self-pay

## 2021-11-27 ENCOUNTER — Encounter: Payer: Self-pay | Admitting: Obstetrics & Gynecology

## 2021-11-27 VITALS — BP 107/80 | HR 93 | Ht 62.0 in | Wt 179.0 lb

## 2021-11-27 DIAGNOSIS — Z98891 History of uterine scar from previous surgery: Secondary | ICD-10-CM

## 2021-11-27 NOTE — Progress Notes (Signed)
°  HPI: Patient returns for routine postoperative follow-up having undergone primary Caesarean section on 11/17/21.  The patient's immediate postoperative recovery has been unremarkable. Since hospital discharge the patient reports no problems.   Current Outpatient Medications: ibuprofen (ADVIL) 600 MG tablet, Take 1 tablet (600 mg total) by mouth every 6 (six) hours., Disp: 30 tablet, Rfl: 0 oxyCODONE (OXY IR/ROXICODONE) 5 MG immediate release tablet, Take 1-2 tablets (5-10 mg total) by mouth every 4 (four) hours as needed for moderate pain., Disp: 20 tablet, Rfl: 0 Pediatric Multivitamins-Iron (FLINTSTONES COMPLETE) 10 MG CHEW, Chew 2 tablets by mouth daily., Disp: , Rfl:   No current facility-administered medications for this visit.    Blood pressure 107/80, pulse 93, height 5\' 2"  (1.575 m), weight 179 lb (81.2 kg), currently breastfeeding.  Physical Exam: Incision clean dry intact Steri strips removed GV placed  Diagnostic Tests:   Pathology:   Impression:   ICD-10-CM   1. S/P cesarean section  Z98.891         Plan: Considering drospirenone 4 mg as POP for BCM    Follow up: 4-5 weeks, cancel scheduled pp visit   04-04-1983, MD

## 2021-12-01 ENCOUNTER — Telehealth (HOSPITAL_COMMUNITY): Payer: Self-pay

## 2021-12-01 NOTE — Telephone Encounter (Signed)
°  No answer. Left message to return nurse call.  Sharyn Lull [facility] 12/01/2021,1238

## 2021-12-17 ENCOUNTER — Encounter: Payer: Self-pay | Admitting: Women's Health

## 2021-12-18 ENCOUNTER — Ambulatory Visit: Payer: BC Managed Care – PPO | Admitting: Women's Health

## 2022-01-01 ENCOUNTER — Other Ambulatory Visit: Payer: Self-pay

## 2022-01-01 ENCOUNTER — Encounter: Payer: Self-pay | Admitting: Women's Health

## 2022-01-01 ENCOUNTER — Ambulatory Visit (INDEPENDENT_AMBULATORY_CARE_PROVIDER_SITE_OTHER): Payer: BC Managed Care – PPO | Admitting: Women's Health

## 2022-01-01 DIAGNOSIS — Z30018 Encounter for initial prescription of other contraceptives: Secondary | ICD-10-CM

## 2022-01-01 MED ORDER — PHEXXI 1.8-1-0.4 % VA GEL: 1.0000 | Freq: Once | VAGINAL | 11 refills | Status: AC

## 2022-01-01 NOTE — Progress Notes (Signed)
POSTPARTUM VISIT Patient name: Jill [name] MRN [id number]  Date of birth: Nov 15, 1996 Chief Complaint:   Postpartum Care  History of Present Illness:   [name] is a 26 y.o. G71P1001 Caucasian female being seen today for a postpartum visit. She is 6 weeks postpartum following a primary cesarean section, low transverse incision at 41.0 gestational weeks d/t arrest of descent/OP. IOL: no, for n/a. Anesthesia:  epidural then spinal, then pudendal block .  Laceration: n/a.  Complications: none. Inpatient contraception: no.   Pregnancy uncomplicated. Tobacco use: no. Substance use disorder: no. Last pap smear: 04/20/20 and results were NILM w/ HRHPV not done. Next pap smear due: 2024 No LMP recorded. (Menstrual status: Lactating).  Postpartum course has been uncomplicated. Feels vulva is swollen, wants me to look. Bleeding none. Bowel function is normal. Bladder function is normal. Urinary incontinence? no, fecal incontinence? no Patient is not sexually active. Last sexual activity: prior to birth of baby. Desired contraception:  condoms and w/drawal. Discussed Phexxi, would like to try . Patient does want a pregnancy in the future.  Desired family size is 2-3 children.   Upstream - 01/01/22 1343       Pregnancy Intention Screening   Does the patient want to become pregnant in the next year? No    Does the patient's partner want to become pregnant in the next year? No    Would the patient like to discuss contraceptive options today? No      Contraception Wrap Up   Current Method Abstinence    End Method Abstinence    Contraception Counseling Provided No            The pregnancy intention screening data noted above was reviewed. Potential methods of contraception were discussed. The patient elected to proceed with Abstinence.  Edinburgh Postpartum Depression Screening: negative, some anxiety, feels she is coping well  Edinburgh Postnatal Depression Scale - 01/01/22 1339        Edinburgh Postnatal Depression Scale:  In the Past 7 Days   I have been able to laugh and see the funny side of things. 0    I have looked forward with enjoyment to things. 0    I have blamed myself unnecessarily when things went wrong. 1    I have been anxious or worried for no good reason. 2    I have felt scared or panicky for no good reason. 2    Things have been getting on top of me. 1    I have been so unhappy that I have had difficulty sleeping. 0    I have felt sad or miserable. 0    I have been so unhappy that I have been crying. 0    The thought of harming myself has occurred to me. 0    Edinburgh Postnatal Depression Scale Total 6             GAD 7 : Generalized Anxiety Score 08/21/2021 04/20/2020  Nervous, Anxious, on Edge 0 0  Control/stop worrying 1 0  Worry too much - different things 1 0  Trouble relaxing 0 1  Restless 0 0  Easily annoyed or irritable 1 2  Afraid - awful might happen 0 0  Total GAD 7 Score 3 3     Baby's course has been uncomplicated. Baby is feeding by breast: milk supply adequate. Infant has a pediatrician/family doctor? Yes.  Childcare strategy if returning to work/school:  did not discuss .  Pt has  material needs met for her and baby: Yes.   Review of Systems:   Pertinent items are noted in HPI Denies Abnormal vaginal discharge w/ itching/odor/irritation, headaches, visual changes, shortness of breath, chest pain, abdominal pain, severe nausea/vomiting, or problems with urination or bowel movements. Pertinent History Reviewed:  Reviewed past medical,surgical, obstetrical and family history.  Reviewed problem list, medications and allergies. OB History  Gravida Para Term Preterm AB Living  1 1 1  0 0 1  SAB IAB Ectopic Multiple Live Births  0 0 0 0 1    # Outcome Date GA Lbr Len/2nd Weight Sex Delivery Anes PTL Lv  1 Term 11/17/21 [id number] / 04:12 8 lb 10.1 oz (3.915 kg) F CS-LTranv EPI  LIV   Physical Assessment:   Vitals:    01/01/22 1334  BP: 107/67  Pulse: 67  Weight: 177 lb (80.3 kg)  Height: 5' 2"  (1.575 m)  Body mass index is 32.37 kg/m.       Physical Examination:   General appearance: alert, well appearing, and in no distress  Mental status: alert, oriented to person, place, and time  Skin: warm & dry   Cardiovascular: normal heart rate noted   Respiratory: normal respiratory effort, no distress   Breasts: deferred, no complaints   Abdomen: soft, non-tender, c/s incision well healed   Pelvic: normal exam. Thin prep pap obtained: No  Rectal: not examined  Extremities: Edema: none   Chaperone: Latisha Cresenzo         No results found for this or any previous visit (from the past 24 hour(s)).  Assessment & Plan:  1) Postpartum exam 2) 6 wks s/p primary cesarean section, low transverse incision d/t FTD/OP 3) breast feeding 4) Depression screening 5) Contraception condoms and Phexxi (no samples available), rx sent to [facility]  Essential components of care per ACOG recommendations:  1.  Mood and well being:  If positive depression screen, discussed and plan developed.  If using tobacco we discussed reduction/cessation and risk of relapse If current substance abuse, we discussed and referral to local resources was offered.   2. Infant care and feeding:  If breastfeeding, discussed returning to work, pumping, breastfeeding-associated pain, guidance regarding return to fertility while lactating if not using another method. If needed, patient was provided with a letter to be allowed to pump q 2-3hrs to support lactation in a private location with access to a refrigerator to store breastmilk.   Recommended that all caregivers be immunized for flu, pertussis and other preventable communicable diseases If pt does not have material needs met for her/baby, referred to local resources for help obtaining these.  3. Sexuality, contraception and birth spacing Provided guidance regarding sexuality,  management of dyspareunia, and resumption of intercourse Discussed avoiding interpregnancy interval <672ms and recommended birth spacing of 18 months  4. Sleep and fatigue Discussed coping options for fatigue and sleep disruption Encouraged family/partner/community support of 4 hrs of uninterrupted sleep to help with mood and fatigue  5. Physical recovery  If pt had a C/S, assessed incisional pain and providing guidance on normal vs prolonged recovery If pt had a laceration, perineal healing and pain reviewed.  If urinary or fecal incontinence, discussed management and referred to PT or uro/gyn if indicated  Patient is safe to resume physical activity. Discussed attainment of healthy weight.  6.  Chronic disease management Discussed pregnancy complications if any, and their implications for future childbearing and long-term maternal health. Review recommendations for prevention of recurrent pregnancy complications,  such as 17 hydroxyprogesterone caproate to reduce risk for recurrent PTB not applicable, or aspirin to reduce risk of preeclampsia not applicable. Pt had GDM: no. If yes, 2hr GTT scheduled: not applicable. Reviewed medications and non-pregnant dosing including consideration of whether pt is breastfeeding using a reliable resource such as LactMed: not applicable Referred for f/u w/ PCP or subspecialist providers as indicated: not applicable  7. Health maintenance Mammogram at 24yo or earlier if indicated Pap smears as indicated  Meds:  Meds ordered this encounter  Medications   Lactic Ac-Citric Ac-Pot Bitart (PHEXXI) 1.8-1-0.4 % GEL    Sig: Place 1 Applicatorful vaginally once for 1 dose. Up to 1 hour before sex    Dispense:  180 g    Refill:  11    Order Specific Question:   Supervising Provider    Answer:   Florian Buff [[id number]]    Follow-up: Return in about 1 year (around 01/01/2023) for Physical.   No orders of the defined types were placed in this  encounter.   [place], [facility] 01/01/2022 2:14 PM

## 2022-01-02 ENCOUNTER — Telehealth: Payer: Self-pay

## 2022-01-02 NOTE — Telephone Encounter (Signed)
Called pt to verify whether [facility] pharmacy had reached out to her regarding her Phexxi prescription. Two identifiers used. Pt had received a text, but had not responded to it. Encouraged pt to reply to text and follow-up as needed. Pt confirmed understanding.

## 2022-01-07 ENCOUNTER — Encounter: Payer: Self-pay | Admitting: Women's Health

## 2022-01-07 ENCOUNTER — Encounter: Payer: Self-pay | Admitting: *Deleted

## 2022-01-25 ENCOUNTER — Encounter: Payer: Self-pay | Admitting: Women's Health

## 2022-01-25 MED ORDER — NORETHINDRONE 0.35 MG PO TABS: 1.0000 | ORAL_TABLET | Freq: Every day | ORAL | 11 refills | Status: DC

## 2022-03-04 ENCOUNTER — Encounter: Payer: Self-pay | Admitting: Women's Health

## 2022-04-16 ENCOUNTER — Ambulatory Visit
Admission: RE | Admit: 2022-04-16 | Discharge: 2022-04-16 | Disposition: A | Discharge status: Home / Self Care | Payer: BC Managed Care – PPO | Source: Ambulatory Visit | Attending: Nurse Practitioner | Admitting: Nurse Practitioner

## 2022-04-16 VITALS — BP 112/76 | HR 104 | Temp 98.6°F | Resp 18

## 2022-04-16 DIAGNOSIS — R350 Frequency of micturition: Secondary | ICD-10-CM

## 2022-04-16 DIAGNOSIS — N898 Other specified noninflammatory disorders of vagina: Secondary | ICD-10-CM | POA: Diagnosis not present

## 2022-04-16 DIAGNOSIS — Z113 Encounter for screening for infections with a predominantly sexual mode of transmission: Secondary | ICD-10-CM | POA: Insufficient documentation

## 2022-04-16 DIAGNOSIS — J069 Acute upper respiratory infection, unspecified: Secondary | ICD-10-CM | POA: Diagnosis not present

## 2022-04-16 LAB — POCT URINALYSIS DIP (MANUAL ENTRY)
Bilirubin, UA: NEGATIVE
Glucose, UA: NEGATIVE mg/dL
Ketones, POC UA: NEGATIVE mg/dL
Leukocytes, UA: NEGATIVE
Nitrite, UA: NEGATIVE
Protein Ur, POC: NEGATIVE mg/dL
Spec Grav, UA: 1.025 (ref 1.010–1.025)
Urobilinogen, UA: 0.2 E.U./dL
pH, UA: 5.5 (ref 5.0–8.0)

## 2022-04-16 MED ORDER — NITROFURANTOIN MONOHYD MACRO 100 MG PO CAPS: 100.0000 mg | ORAL_CAPSULE | Freq: Two times a day (BID) | ORAL | 0 refills | Status: AC

## 2022-04-16 NOTE — ED Triage Notes (Signed)
Pt states she has symptoms of a UTI since Sunday  Pt states she has pain in her lower abdomin, a frequent urge to urinate and a smell to urine  Denies Meds

## 2022-04-16 NOTE — ED Provider Notes (Signed)
[facility]    CSN: [id number] Arrival date & time: 04/16/22  1319      History   Chief Complaint Chief Complaint  Patient presents with   Abdominal Pain    I believe I have an uti. Pelvic pain and increased urge to pee. I'm also having some sinus issues; congestion, sore throat, cough, tiredness. - Entered by patient    HPI Jill [name] is a 26 y.o. female.   Patient presents with lower abdominal pressure, urinary urgency, frequency, and vaginal discharge.  She reports the symptoms started over the weekend.  She thinks the vaginal discharge has resolved.  She denies any burning with urination, fevers, body aches, chills, nausea or vomiting.  She is having some new low back pain on the right side.  She reports the vaginal discharge was thick, yellow in color, and kind of "clumpy".  Patient also reports she is having some some upper respiratory symptoms including cough, sinus pressure and congestion, runny nose, postnasal drainage, and sore throat that started Friday.  She has not taken a COVID test at home but reports her infant daughter was seen in the hospital last night and diagnosed with a viral upper respiratory infection.  She is wondering what she can take to help with her symptoms.   Past Medical History:  Diagnosis Date   Medical history non-contributory     Patient Active Problem List   Diagnosis Date Noted   Cesarean delivery delivered 11/19/2021    Past Surgical History:  Procedure Laterality Date   CESAREAN SECTION N/A 11/17/2021   Procedure: CESAREAN SECTION;  Surgeon: Myna Hidalgo, DO;  Location: [facility] LD ORS;  Service: Obstetrics;  Laterality: N/A;   WISDOM TOOTH EXTRACTION Bilateral     OB History     Gravida  1   Para  1   Term  1   Preterm  0   AB  0   Living  1      SAB  0   IAB  0   Ectopic  0   Multiple  0   Live Births  1            Home Medications    Prior to Admission medications   Medication Sig  Start Date End Date Taking? Authorizing Provider  nitrofurantoin, macrocrystal-monohydrate, (MACROBID) 100 MG capsule Take 1 capsule (100 mg total) by mouth 2 (two) times daily for 5 days. 04/16/22 04/21/22 Yes Valentino Nose, NP  norethindrone (CAMILA) 0.35 MG tablet Take 1 tablet (0.35 mg total) by mouth daily. 01/25/22   Lazaro Arms, MD  Pediatric Multivitamins-[facility] COMPLETE) 10 MG CHEW Chew 2 tablets by mouth daily.    [provider]    Family History Family History  Problem Relation Age of Onset   Diverticulitis Mother    Gallbladder disease Father    Gallbladder disease Sister    Von Willebrand disease Sister    Alcohol abuse Maternal Grandfather    Diverticulitis Maternal Grandfather    Cancer Maternal Grandfather        lung   Cancer Paternal Grandmother    Hypertension Paternal Grandfather    Cerebral aneurysm Paternal Grandfather     Social History Social History   Tobacco Use   Smoking status: Never   Smokeless tobacco: Never  Vaping Use   Vaping Use: Never used  Substance Use Topics   Alcohol use: Yes    Comment: occasional   Drug use: Never  Allergies   Patient has no known allergies.   Review of Systems Review of Systems Per HPI  Physical Exam Triage Vital Signs ED Triage Vitals  Enc Vitals Group     BP 04/16/22 1335 112/76     Pulse Rate 04/16/22 1335 (!) 104     Resp 04/16/22 1335 18     Temp 04/16/22 1335 98.6 F (37 C)     Temp Source 04/16/22 1335 Oral     SpO2 04/16/22 1335 98 %     Weight --      Height --      Head Circumference --      Peak Flow --      Pain Score 04/16/22 1332 4     Pain Loc --      Pain Edu? --      Excl. in GC? --    No data found.  Updated Vital Signs BP 112/76 (BP Location: Right Arm)   Pulse (!) 104   Temp 98.6 F (37 C) (Oral)   Resp 18   LMP 04/01/2022 (Approximate)   SpO2 98%   Breastfeeding No   Visual Acuity Right Eye Distance:   Left Eye Distance:    Bilateral Distance:    Right Eye Near:   Left Eye Near:    Bilateral Near:     Physical Exam Vitals and nursing note reviewed.  Constitutional:      General: She is not in acute distress.    Appearance: Normal appearance. She is obese. She is not toxic-appearing.  HENT:     Head: Normocephalic and atraumatic.     Right Ear: External ear normal.     Left Ear: External ear normal.     Nose: Congestion and rhinorrhea present.     Mouth/Throat:     Mouth: Mucous membranes are moist.     Pharynx: Oropharynx is clear.  Eyes:     General: No scleral icterus.    Extraocular Movements: Extraocular movements intact.  Cardiovascular:     Rate and Rhythm: Normal rate and regular rhythm.  Pulmonary:     Effort: Pulmonary effort is normal. No respiratory distress.     Breath sounds: Normal breath sounds. No wheezing, rhonchi or rales.  Abdominal:     General: Abdomen is flat. Bowel sounds are normal.     Palpations: Abdomen is soft.     Tenderness: There is abdominal tenderness in the suprapubic area. There is no right CVA tenderness, left CVA tenderness or guarding.  Genitourinary:    Comments: Deferred using shared decision making Musculoskeletal:     Cervical back: Normal range of motion.  Lymphadenopathy:     Cervical: No cervical adenopathy.  Skin:    General: Skin is warm and dry.     Capillary Refill: Capillary refill takes less than 2 seconds.     Coloration: Skin is not jaundiced or pale.     Findings: No erythema.  Neurological:     Mental Status: She is alert and oriented to person, place, and time.  Psychiatric:        Behavior: Behavior is cooperative.     UC Treatments / Results  Labs (all labs ordered are listed, but only abnormal results are displayed) Labs Reviewed  POCT URINALYSIS DIP (MANUAL ENTRY) - Abnormal; Notable for the following components:      Result Value   Blood, UA trace-intact (*)    All other components within normal limits  URINE CULTURE   CERVICOVAGINAL  ANCILLARY ONLY    EKG   Radiology No results found.  Procedures Procedures (including critical care time)  Medications Ordered in UC Medications - No data to display  Initial Impression / Assessment and Plan / UC Course  I have reviewed the triage vital signs and the nursing notes.  Pertinent labs & imaging results that were available during my care of the patient were reviewed by me and considered in my medical decision making (see chart for details).    Urinalysis today shows trace amount of intact blood.  We will treat empirically given symptoms for UTI with Macrobid 100 mg twice daily for 5 days.  Send urine for culture.  We will also check vaginal swab for gonorrhea, chlamydia, trichomonas, yeast vaginitis, bacterial vaginosis.  Treat as indicated.  We discussed that her upper respiratory symptoms are likely viral in nature.  Offered COVID-19 testing, however patient declines for now.  Discussed symptomatic/supportive care with increasing hydration, use of plain Mucinex to help loosen congestion, nasal saline rinses, steam showers, humidifier.  Seek care if symptoms persist or worsen despite treatment.  Final Clinical Impressions(s) / UC Diagnoses   Final diagnoses:  Urinary frequency  Vaginal discharge  Viral URI with cough     Discharge Instructions      - The urine sample today shows a small amount of blood - Based on your symptoms, I would like for you to start Macrobid 100 mg twice daily and take it for 5 days.  We will let you know if the urine culture suggests we need to treat with a different antibiotic. -We will let you know if the vaginal swab comes back showing anything abnormal -You can start plain Mucinex, nasal saline rinses, and pushing hydration to help with the nasal congestion and your viral upper respiratory symptoms -Please seek care if your symptoms worsen or if they do not improve despite this treatment      ED Prescriptions      Medication Sig Dispense Auth. Provider   nitrofurantoin, macrocrystal-monohydrate, (MACROBID) 100 MG capsule Take 1 capsule (100 mg total) by mouth 2 (two) times daily for 5 days. 10 capsule Valentino Nose, NP      PDMP not reviewed this encounter.   Valentino Nose, NP 04/16/22 1601

## 2022-04-16 NOTE — Discharge Instructions (Addendum)
-   The urine sample today shows a small amount of blood - Based on your symptoms, I would like for you to start Macrobid 100 mg twice daily and take it for 5 days.  We will let you know if the urine culture suggests we need to treat with a different antibiotic. -We will let you know if the vaginal swab comes back showing anything abnormal -You can start plain Mucinex, nasal saline rinses, and pushing hydration to help with the nasal congestion and your viral upper respiratory symptoms -Please seek care if your symptoms worsen or if they do not improve despite this treatment

## 2022-04-17 LAB — CERVICOVAGINAL ANCILLARY ONLY
Bacterial Vaginitis (gardnerella): NEGATIVE
Candida Glabrata: NEGATIVE
Candida Vaginitis: NEGATIVE
Chlamydia: NEGATIVE
Comment: NEGATIVE
Comment: NEGATIVE
Comment: NEGATIVE
Comment: NEGATIVE
Comment: NEGATIVE
Comment: NORMAL
Neisseria Gonorrhea: NEGATIVE
Trichomonas: NEGATIVE

## 2022-04-17 LAB — URINE CULTURE: Culture: NO GROWTH

## 2022-08-01 IMAGING — US US ABDOMEN LIMITED
1 series · 16 of 25 positions shown · non-contrast
Comparison: None.

CLINICAL DATA: Right upper quadrant abdominal pain

EXAM:
ULTRASOUND ABDOMEN LIMITED RIGHT UPPER QUADRANT

[Series 1: us abdomen limited · 76 acquisitions, 16 frames shown]
[im 1/76]
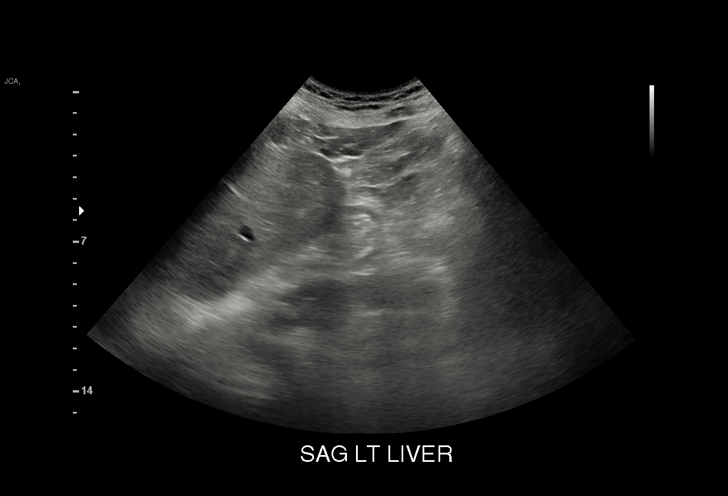
[im 7/76]
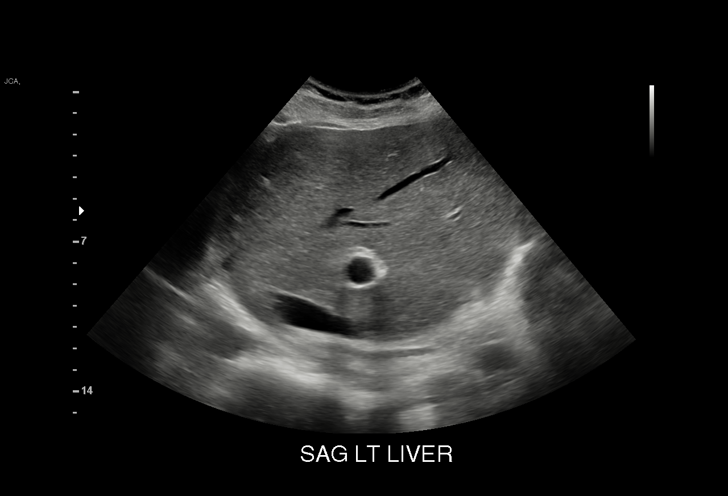
[im 10/76]
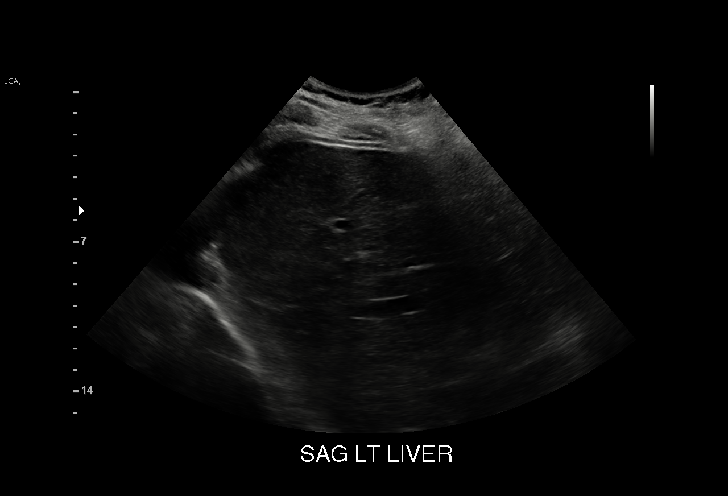
[im 16/76]
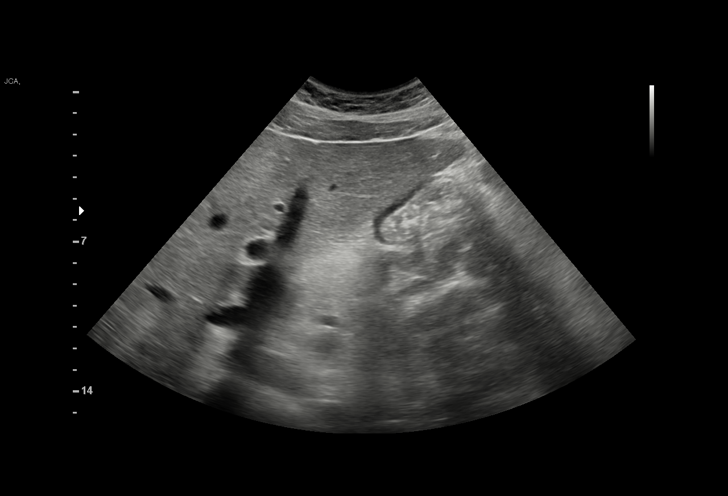
[im 22/76]
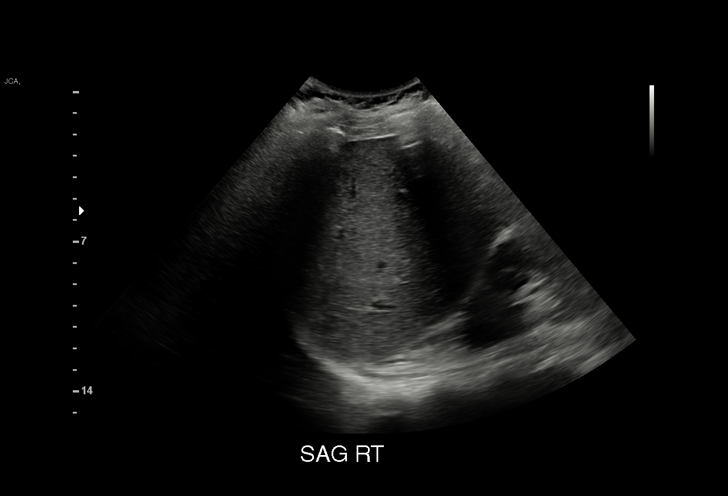
[im 26/76]
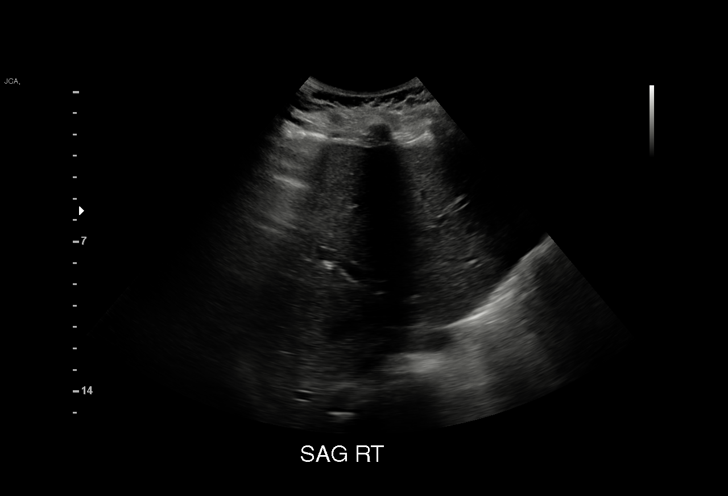
[im 32/76]
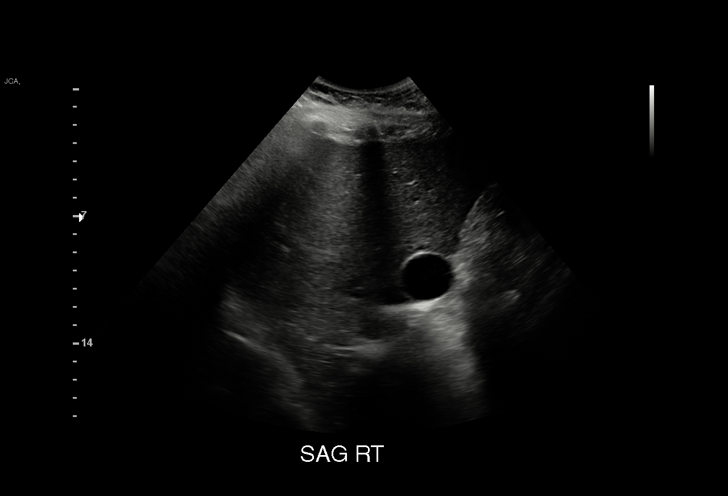
[im 35/76]
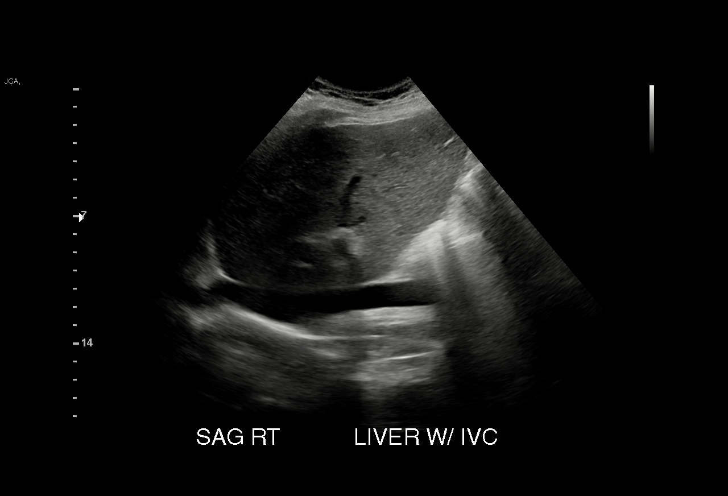
[im 41/76]
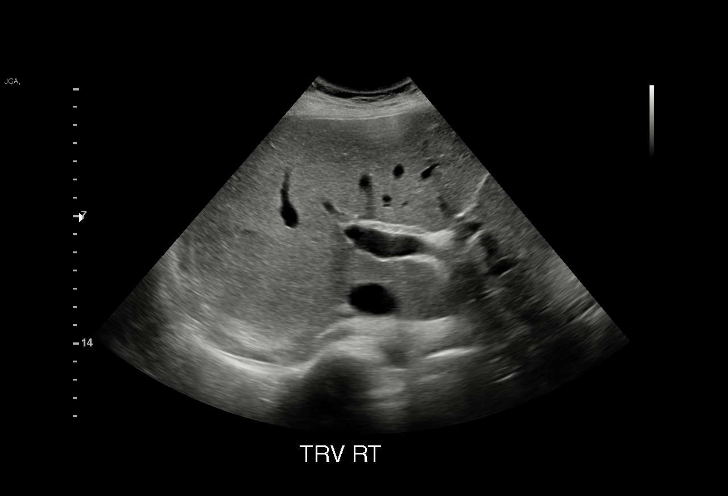
[im 44/76]
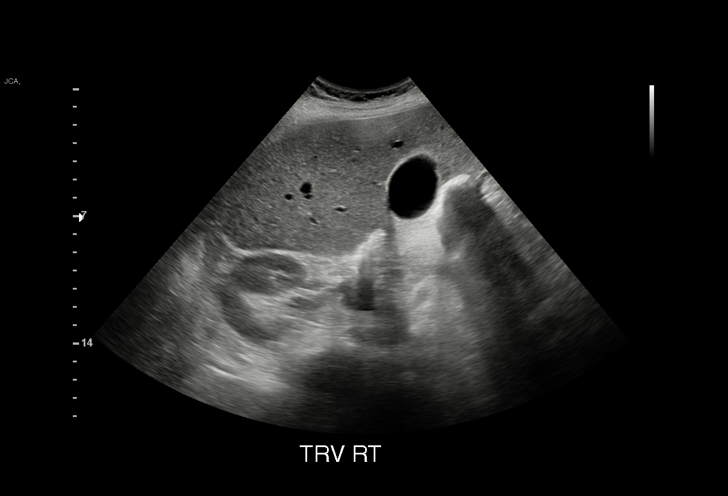
[im 51/76]
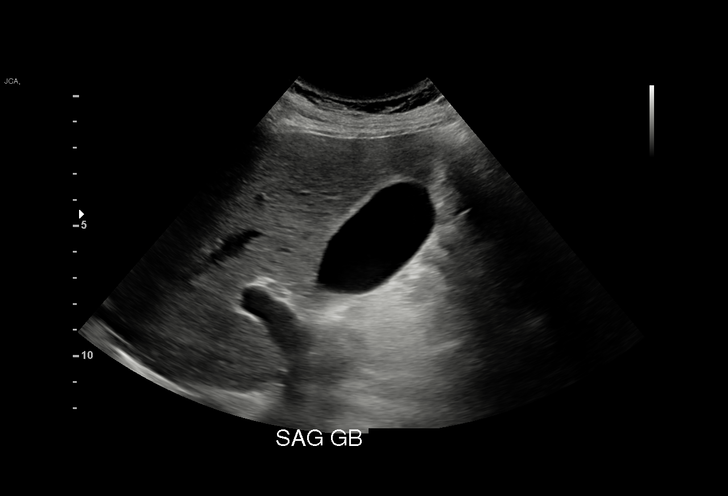
[im 54/76]
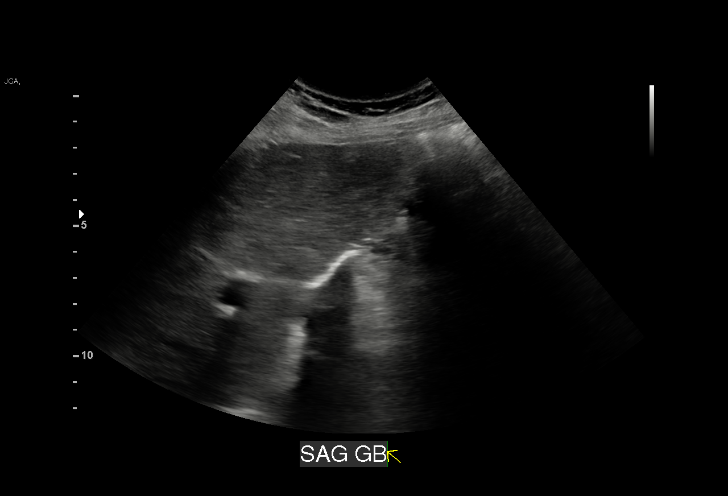
[im 60/76]
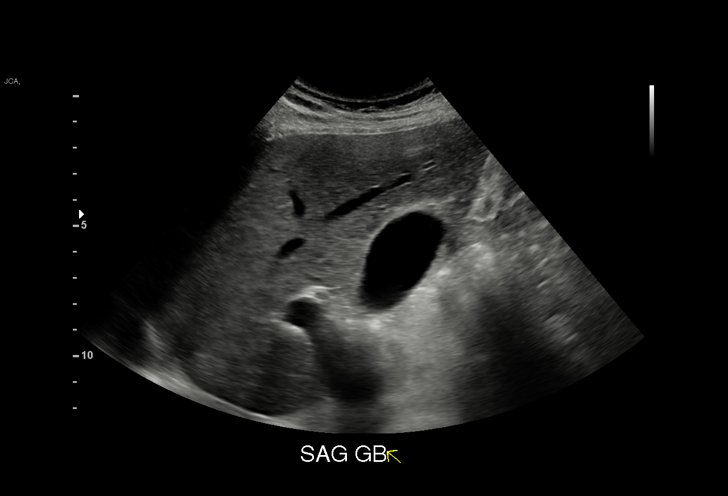
[im 66/76]
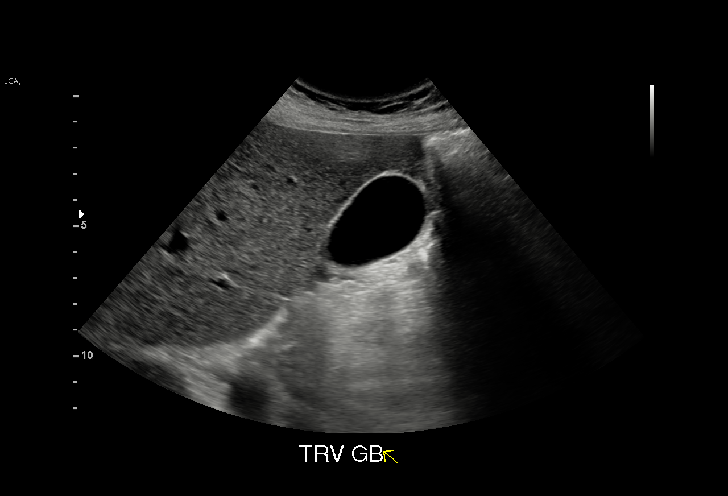
[im 69/76]
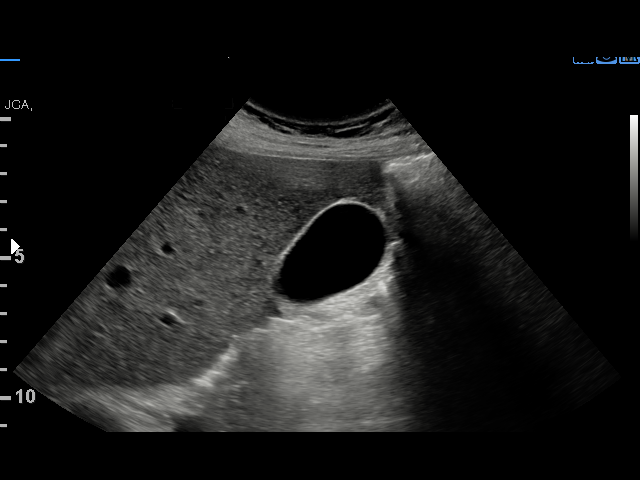
[im 76/76]
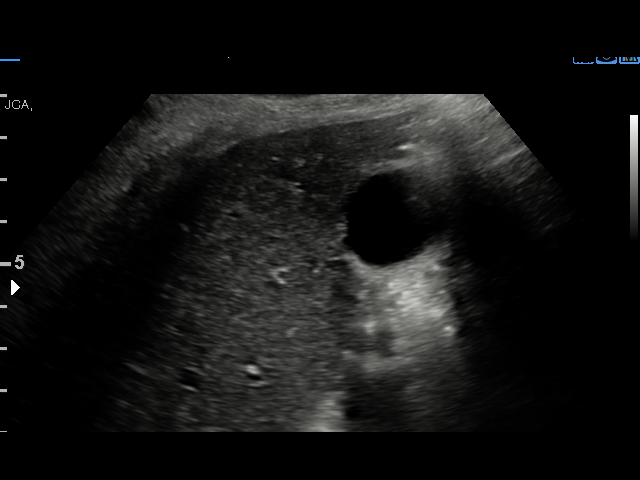

[16 of 25 positions shown; findings below may reference images not displayed]

FINDINGS: Gallbladder:

No gallstones or wall thickening visualized. No sonographic Murphy
sign noted by sonographer.

Common bile duct:

Diameter: 2 mm

Liver:

No focal lesion identified. Within normal limits in parenchymal
echogenicity. Portal vein is patent on color Doppler imaging with
normal direction of blood flow towards the liver.

Other: None.
IMPRESSION: Negative right upper quadrant ultrasound.

## 2022-08-26 ENCOUNTER — Ambulatory Visit: Admit: 2022-08-26 | Discharge status: Absent without leave | Payer: BC Managed Care – PPO

## 2022-09-09 ENCOUNTER — Other Ambulatory Visit (HOSPITAL_COMMUNITY): Payer: Self-pay | Admitting: Internal Medicine

## 2022-09-09 DIAGNOSIS — R109 Unspecified abdominal pain: Secondary | ICD-10-CM

## 2022-09-10 ENCOUNTER — Emergency Department (HOSPITAL_COMMUNITY): Payer: BC Managed Care – PPO

## 2022-09-10 ENCOUNTER — Encounter (HOSPITAL_COMMUNITY): Payer: Self-pay | Admitting: *Deleted

## 2022-09-10 ENCOUNTER — Emergency Department (HOSPITAL_COMMUNITY)
Admission: EM | Admit: 2022-09-10 | Discharge: 2022-09-10 | Disposition: A | Discharge status: Home / Self Care | Payer: BC Managed Care – PPO | Attending: Emergency Medicine | Admitting: Emergency Medicine

## 2022-09-10 ENCOUNTER — Other Ambulatory Visit: Payer: Self-pay

## 2022-09-10 DIAGNOSIS — R1011 Right upper quadrant pain: Secondary | ICD-10-CM | POA: Diagnosis present

## 2022-09-10 LAB — COMPREHENSIVE METABOLIC PANEL
ALT: 20 U/L (ref 0–44)
AST: 16 U/L (ref 15–41)
Albumin: 3.9 g/dL (ref 3.5–5.0)
Alkaline Phosphatase: 66 U/L (ref 38–126)
Anion gap: 6 (ref 5–15)
BUN: 20 mg/dL (ref 6–20)
CO2: 26 mmol/L (ref 22–32)
Calcium: 8.9 mg/dL (ref 8.9–10.3)
Chloride: 105 mmol/L (ref 98–111)
Creatinine, Ser: 0.62 mg/dL (ref 0.44–1.00)
GFR, Estimated: >60 mL/min (ref >60)
Glucose, Bld: 90 mg/dL (ref 70–99)
Potassium: 4.1 mmol/L (ref 3.5–5.1)
Sodium: 137 mmol/L (ref 135–145)
Total Bilirubin: 0.8 mg/dL (ref 0.3–1.2)
Total Protein: 7.1 g/dL (ref 6.5–8.1)

## 2022-09-10 LAB — URINALYSIS, ROUTINE W REFLEX MICROSCOPIC
Bacteria, UA: NONE SEEN
Bilirubin Urine: NEGATIVE
Glucose, UA: NEGATIVE mg/dL
Ketones, ur: NEGATIVE mg/dL
Leukocytes,Ua: NEGATIVE
Nitrite: NEGATIVE
Protein, ur: NEGATIVE mg/dL
RBC / HPF: >50 RBC/hpf — ABNORMAL HIGH (ref 0–5)
Specific Gravity, Urine: 1.021 (ref 1.005–1.030)
pH: 5 (ref 5.0–8.0)

## 2022-09-10 LAB — CBC WITH DIFFERENTIAL/PLATELET
Abs Immature Granulocytes: 0.02 10*3/uL (ref 0.00–0.07)
Basophils Absolute: 0 10*3/uL (ref 0.0–0.1)
Basophils Relative: 0 %
Eosinophils Absolute: 0.1 10*3/uL (ref 0.0–0.5)
Eosinophils Relative: 1 %
HCT: 40.5 % (ref 36.0–46.0)
Hemoglobin: 13.2 g/dL (ref 12.0–15.0)
Immature Granulocytes: 0 %
Lymphocytes Relative: 20 %
Lymphs Abs: 2 10*3/uL (ref 0.7–4.0)
MCH: 28.3 pg (ref 26.0–34.0)
MCHC: 32.6 g/dL (ref 30.0–36.0)
MCV: 86.9 fL (ref 80.0–100.0)
Monocytes Absolute: 0.6 10*3/uL (ref 0.1–1.0)
Monocytes Relative: 6 %
Neutro Abs: 7.4 10*3/uL (ref 1.7–7.7)
Neutrophils Relative %: 73 %
Platelets: 283 10*3/uL (ref 150–400)
RBC: 4.66 MIL/uL (ref 3.87–5.11)
RDW: 13.3 % (ref 11.5–15.5)
WBC: 10.1 10*3/uL (ref 4.0–10.5)
nRBC: 0 % (ref 0.0–0.2)

## 2022-09-10 LAB — LIPASE, BLOOD: Lipase: 33 U/L (ref 11–51)

## 2022-09-10 LAB — POC URINE PREG, ED: Preg Test, Ur: NEGATIVE

## 2022-09-10 LAB — HCG, QUANTITATIVE, PREGNANCY: hCG, Beta Chain, Quant, S: <1 m[IU]/mL (ref <5)

## 2022-09-10 MED ORDER — PANTOPRAZOLE SODIUM 20 MG PO TBEC: 20.0000 mg | DELAYED_RELEASE_TABLET | Freq: Every day | ORAL | 0 refills | Status: DC

## 2022-09-10 MED ORDER — ONDANSETRON HCL 4 MG PO TABS: 4.0000 mg | ORAL_TABLET | Freq: Four times a day (QID) | ORAL | 0 refills | Status: DC

## 2022-09-10 MED ORDER — HYDROCODONE-ACETAMINOPHEN 5-325 MG PO TABS: 1.0000 | ORAL_TABLET | ORAL | 0 refills | Status: DC | PRN

## 2022-09-10 NOTE — ED Provider Notes (Signed)
[facility] Provider Note   CSN: [id number] Arrival date & time: 09/10/22  0900     History  No chief complaint on file.   Jill [name] is a 26 y.o. female with no significant past medical history but currently under the care of [employer]. for a work-related lumbar injury presenting with several complaints, predominantly right upper quadrant pain which started approximately 2 weeks ago and has been intermittent pain, usually associated with food intake.  She describes sharp stabbing pain that radiates into her right scapular and sometimes flank region and is associated with nausea and emesis. She also mentions that she is currently on her menses and developed severe left lower pelvic pain this morning around 7 AM, however this symptom has improved.  She has been seen for the right upper quadrant pain by her primary provider who is in the process of arranging she had an ultrasound over concern for this being gallbladder related, but her pain became severe today.  She denies fevers or chills, she has had a few looser than normal stools.  Denies acid reflux, early satiety, chest pain, shortness of breath.  She has found no alleviators for symptoms.  The history is provided by the patient.       Home Medications Prior to Admission medications   Medication Sig Start Date End Date Taking? Authorizing Provider  HYDROcodone-acetaminophen (NORCO/VICODIN) 5-325 MG tablet Take 1 tablet by mouth every 4 (four) hours as needed. 09/10/22  Yes Birttany Dechellis, Almyra Free, PA-C  ondansetron (ZOFRAN) 4 MG tablet Take 1 tablet (4 mg total) by mouth every 6 (six) hours. 09/10/22  Yes Abella Shugart, Almyra Free, PA-C  pantoprazole (PROTONIX) 20 MG tablet Take 1 tablet (20 mg total) by mouth daily. 09/10/22  Yes Eward Rutigliano, Almyra Free, PA-C  norethindrone (CAMILA) 0.35 MG tablet Take 1 tablet (0.35 mg total) by mouth daily. Patient not taking: Reported on 09/10/2022 01/25/22   Florian Buff, MD      Allergies     Patient has no known allergies.    Review of Systems   Review of Systems  Constitutional:  Negative for chills and fever.  HENT:  Negative for congestion and sore throat.   Eyes: Negative.   Respiratory:  Negative for chest tightness and shortness of breath.   Cardiovascular:  Negative for chest pain.  Gastrointestinal:  Positive for abdominal pain, diarrhea, nausea and vomiting.  Genitourinary: Negative.   Musculoskeletal:  Negative for arthralgias, joint swelling and neck pain.  Skin: Negative.  Negative for rash and wound.  Neurological:  Negative for dizziness, weakness, light-headedness, numbness and headaches.  Psychiatric/Behavioral: Negative.      Physical Exam Updated Vital Signs BP 112/71   Pulse 69   Temp 98.4 F (36.9 C) (Oral)   Resp 18   Ht 5' 2"  (1.575 m)   Wt 89.4 kg   LMP 09/10/2022   SpO2 100%   BMI 36.03 kg/m  Physical Exam Vitals and nursing note reviewed.  Constitutional:      Appearance: She is well-developed.  HENT:     Head: Normocephalic and atraumatic.  Eyes:     Conjunctiva/sclera: Conjunctivae normal.  Cardiovascular:     Rate and Rhythm: Normal rate and regular rhythm.     Heart sounds: Normal heart sounds.  Pulmonary:     Effort: Pulmonary effort is normal.     Breath sounds: Normal breath sounds. No wheezing.  Abdominal:     General: Bowel sounds are normal.     Palpations:  Abdomen is soft.     Tenderness: There is abdominal tenderness in the right upper quadrant. There is guarding. There is no right CVA tenderness or left CVA tenderness. Negative signs include Murphy's sign and McBurney's sign.     Comments: Initial guarding right upper quadrant but then distractible.  Musculoskeletal:        General: Normal range of motion.     Cervical back: Normal range of motion.  Skin:    General: Skin is warm and dry.  Neurological:     Mental Status: She is alert.     ED Results / Procedures / Treatments   Labs (all labs ordered are  listed, but only abnormal results are displayed) Labs Reviewed  URINALYSIS, ROUTINE W REFLEX MICROSCOPIC - Abnormal; Notable for the following components:      Result Value   Hgb urine dipstick LARGE (*)    RBC / HPF >50 (*)    All other components within normal limits  HCG, QUANTITATIVE, PREGNANCY  CBC WITH DIFFERENTIAL/PLATELET  COMPREHENSIVE METABOLIC PANEL  LIPASE, BLOOD  POC URINE PREG, ED    EKG None  Radiology CT Renal Stone Study  Result Date: 09/10/2022 CLINICAL DATA:  Flank pain, right upper quadrant EXAM: CT ABDOMEN AND PELVIS WITHOUT CONTRAST TECHNIQUE: Multidetector CT imaging of the abdomen and pelvis was performed following the standard protocol without IV contrast. RADIATION DOSE REDUCTION: This exam was performed according to the departmental dose-optimization program which includes automated exposure control, adjustment of the mA and/or kV according to patient size and/or use of iterative reconstruction technique. COMPARISON:  Ultrasound from earlier the same day FINDINGS: Lower chest: No pleural or pericardial effusion. Visualized lung bases clear. Hepatobiliary: No focal liver abnormality is seen. No gallstones, gallbladder wall thickening, or biliary dilatation. Pancreas: Unremarkable. No pancreatic ductal dilatation or surrounding inflammatory changes. Spleen: Normal in size without focal abnormality. Adrenals/Urinary Tract: Adrenal glands are unremarkable. Kidneys are normal, without renal calculi, focal lesion, or hydronephrosis. Bladder is unremarkable. Stomach/Bowel: Stomach is incompletely distended, unremarkable. Small bowel decompressed. Normal appendix. The colon is incompletely distended, unremarkable. Vascular/Lymphatic: No significant vascular findings are present. Circumaortic left renal vein, an anatomic variant. No enlarged abdominal or pelvic lymph nodes. Reproductive: Uterus and bilateral adnexa are unremarkable. Other: No ascites.  No free air.  Musculoskeletal: No acute or significant osseous findings. IMPRESSION: No acute findings. No urolithiasis or hydronephrosis. Electronically Signed   By: Lucrezia Europe M.D.   On: 09/10/2022 16:06      US Abdomen Limited RUQ (LIVER/GB)  Result Date: 09/10/2022 CLINICAL DATA:  Right upper quadrant abdominal pain. EXAM: ULTRASOUND ABDOMEN LIMITED RIGHT UPPER QUADRANT COMPARISON:  10/20/2021. FINDINGS: Gallbladder: No gallstones or wall thickening visualized. No sonographic Murphy sign noted by sonographer. Common bile duct: Diameter: 3 mm Liver: No focal lesion identified. Within normal limits in parenchymal echogenicity. Portal vein is patent on color Doppler imaging with normal direction of blood flow towards the liver. Other: None. IMPRESSION: Normal right upper quadrant ultrasound. Electronically Signed   By: Lajean Manes M.D.   On: 09/10/2022 10:42    Procedures Procedures    Medications Ordered in ED Medications - No data to display  ED Course/ Medical Decision Making/ A&P                           Medical Decision Making Patient with right upper quadrant ultrasound with radiation into her back, triggered by meals, most suspicious for acute cholecystitis, this  could also represent biliary colic, acute hepatitis, atypical GERD, kidney stones, bowel obstruction although exam does not suggest this last diagnosis.  Amount and/or Complexity of Data Reviewed Labs: ordered.    Details: Labs obtained are normal including lipase, c-Met and CBC, her WBC count is 10.1.  She has normal liver function and a normal lipase.  She does have a large amount of hemoglobin in her urine although she is currently on her menses. Radiology: ordered.    Details: Right upper quadrant ultrasound was initially obtained which is negative for gallstones, sludge or acute cholecystitis.  Common bile duct is not dilated.  We proceeded to CT imaging given radiation of pain into her back/flank area, negative for kidney stones  or other intra-abdominal process.  Risk Prescription drug management. Risk Details: Discussed findings with patient including that she may need to take the neck step and have a HIDA scan to eliminate biliary colic as a source of her pain symptoms.  She will contact her primary provider to discuss having this test done.  In the interim she was prescribed a small quantity of hydrocodone, caution given.  Also prescribed Zofran and Protonix to eliminate reflux/gastritis as source of symptoms.           Final Clinical Impression(s) / ED Diagnoses Final diagnoses:  RUQ abdominal pain    Rx / DC Orders ED Discharge Orders          Ordered    HYDROcodone-acetaminophen (NORCO/VICODIN) 5-325 MG tablet  Every 4 hours PRN        09/10/22 1611    ondansetron (ZOFRAN) 4 MG tablet  Every 6 hours        09/10/22 1611    pantoprazole (PROTONIX) 20 MG tablet  Daily        09/10/22 1611              Evalee Jefferson, PA-C 09/12/22 1516    Fransico Meadow, MD 09/13/22 1315

## 2022-09-10 NOTE — Discharge Instructions (Signed)
Your lab tests and CT imaging and ultrasound are all negative today.  However your symptoms are suspicious for possible gallbladder pain which is called biliary colic.  You would benefit from a HIDA scan to better determine if your gallbladder is spasming as the source of these episodic symptoms.  Talk to your primary doctor about getting this test arranged and let him know that today's ultrasound was normal.  You have been prescribed some medications to help you with your symptoms while you are getting this worked up further.  Do not drive within 4 hours of taking hydrocodone as this medication will make you drowsy.  You are also being prescribed some nausea medicine in case she needed along with an acid reducer to help eliminate this source as a possible reason for your symptoms.  Until you have this next test minimize consumption of fatty foods as that intake will trigger normal gallbladder squeezing activity.

## 2022-09-10 NOTE — ED Triage Notes (Signed)
Pt c/o RUQ abdominal pain that radiates to her back intermittently x 2 weeks. Pt also c/o pain and numbness to left groin area that started this morning at 0700. Pt also reports menstrual cramps and recent back injury from work. Pt reports all her symptoms coupled together are making her feel terrible, which led her to the ED today.

## 2022-11-05 ENCOUNTER — Ambulatory Visit: Payer: BC Managed Care – PPO | Admitting: Adult Health

## 2022-11-05 ENCOUNTER — Encounter: Payer: Self-pay | Admitting: Adult Health

## 2022-11-05 VITALS — BP 106/73 | HR 83 | Ht 62.0 in | Wt 201.5 lb

## 2022-11-05 DIAGNOSIS — Z30017 Encounter for initial prescription of implantable subdermal contraceptive: Secondary | ICD-10-CM | POA: Diagnosis not present

## 2022-11-05 DIAGNOSIS — Z3202 Encounter for pregnancy test, result negative: Secondary | ICD-10-CM | POA: Diagnosis not present

## 2022-11-05 HISTORY — DX: Encounter for initial prescription of implantable subdermal contraceptive: Z30.017

## 2022-11-05 LAB — POCT URINE PREGNANCY: Preg Test, Ur: NEGATIVE

## 2022-11-05 MED ORDER — ETONOGESTREL 68 MG ~~LOC~~ IMPL
68.0000 mg | DRUG_IMPLANT | Freq: Once | SUBCUTANEOUS | Status: AC
  Administered 2022-11-05: 68 mg via SUBCUTANEOUS

## 2022-11-05 NOTE — Addendum Note (Signed)
Addended by: Malachy Mood S on: 11/05/2022 12:10 PM   Modules accepted: Orders

## 2022-11-05 NOTE — Progress Notes (Addendum)
  Subjective:     Patient ID: Jill [name], female   DOB: [birth year], 26 y.o.   MRN: [id number]  HPI Jill [name] is a 26 year old white female,married, G1P1001, in for nexplanon insertion. Last sex 2 weeks ago.     Component Value Date/Time   DIAGPAP  04/20/2020 1115    - Negative for intraepithelial lesion or malignancy (NILM)   ADEQPAP  04/20/2020 1115    Satisfactory for evaluation; transformation zone component PRESENT.   PCP is [facility].  Review of Systems For nexplanon insertion Reviewed past medical,surgical, social and family history. Reviewed medications and allergies.     Objective:   Physical Exam BP 106/73 (BP Location: Left Arm, Patient Position: Sitting, Cuff Size: Normal)   Pulse 83   Ht 5\' 2"  (1.575 m)   Wt 201 lb 8 oz (91.4 kg)   LMP 10/16/2022 (Approximate)   Breastfeeding No   BMI 36.85 kg/m  UPT is negative Consent signed, time out called. Left arm cleansed with betadine, and injected with 1.5 cc 2% lidocaine and waited til numb. Nexplanon easily inserted and steri strips applied.Rod easily palpated by provider and pt. Pressure dressing applied.    Fall risk is low  Upstream - 11/05/22 1137       Pregnancy Intention Screening   Does the patient want to become pregnant in the next year? No    Does the patient's partner want to become pregnant in the next year? No    Would the patient like to discuss contraceptive options today? No      Contraception Wrap Up   Current Method Abstinence    End Method Hormonal Implant             Assessment:     1. Pregnancy examination or test, negative result  2. Nexplanon insertion Lot 11/07/22 Exp [id number] Use condoms x 2 weeks, keep clean and dry x 24 hours, no heavy lifting, keep steri strips on x 72 hours, Keep pressure dressing on x 24 hours. Follow up prn problems.     Plan:     Return in 6 months for pap and physical

## 2022-11-05 NOTE — Patient Instructions (Signed)
Use condoms x 2 weeks, keep clean and dry x 24 hours, no heavy lifting, keep steri strips on x 72 hours, Keep pressure dressing on x 24 hours. Follow up prn problems.  

## 2023-04-15 ENCOUNTER — Other Ambulatory Visit: Payer: Self-pay | Admitting: Adult Health

## 2023-04-15 MED ORDER — MEGESTROL ACETATE 40 MG PO TABS: ORAL_TABLET | ORAL | 3 refills | Status: DC

## 2023-04-15 NOTE — Progress Notes (Signed)
Rx megace??  

## 2023-09-19 ENCOUNTER — Other Ambulatory Visit: Payer: Self-pay | Admitting: Adult Health

## 2023-09-19 MED ORDER — MEGESTROL ACETATE 40 MG PO TABS: ORAL_TABLET | ORAL | 3 refills | Status: DC

## 2023-09-19 NOTE — Progress Notes (Signed)
Rx megace??  

## 2023-12-22 ENCOUNTER — Ambulatory Visit
Admission: RE | Admit: 2023-12-22 | Discharge: 2023-12-22 | Disposition: A | Discharge status: Home / Self Care | Payer: BC Managed Care – PPO | Source: Ambulatory Visit | Attending: Physician Assistant | Admitting: Physician Assistant

## 2023-12-22 VITALS — BP 112/74 | HR 70 | Temp 98.4°F | Resp 16

## 2023-12-22 DIAGNOSIS — J40 Bronchitis, not specified as acute or chronic: Secondary | ICD-10-CM

## 2023-12-22 DIAGNOSIS — J329 Chronic sinusitis, unspecified: Secondary | ICD-10-CM | POA: Diagnosis not present

## 2023-12-22 DIAGNOSIS — R062 Wheezing: Secondary | ICD-10-CM | POA: Diagnosis not present

## 2023-12-22 MED ORDER — PREDNISONE 20 MG PO TABS: 40.0000 mg | ORAL_TABLET | Freq: Every day | ORAL | 0 refills | Status: AC

## 2023-12-22 MED ORDER — PROMETHAZINE-DM 6.25-15 MG/5ML PO SYRP: 5.0000 mL | ORAL_SOLUTION | Freq: Three times a day (TID) | ORAL | 0 refills | Status: DC | PRN

## 2023-12-22 MED ORDER — ALBUTEROL SULFATE HFA 108 (90 BASE) MCG/ACT IN AERS
2.0000 | INHALATION_SPRAY | Freq: Once | RESPIRATORY_TRACT | Status: AC
  Administered 2023-12-22: 2 via RESPIRATORY_TRACT

## 2023-12-22 MED ORDER — AMOXICILLIN-POT CLAVULANATE 875-125 MG PO TABS: 1.0000 | ORAL_TABLET | Freq: Two times a day (BID) | ORAL | 0 refills | Status: DC

## 2023-12-22 NOTE — ED Provider Notes (Signed)
[facility]    CSN: [id number] Arrival date & time: 12/22/23  1745      History   Chief Complaint Chief Complaint  Patient presents with   Headache    Headache that gets worse with cough (pain in back of head). Can not bend over, vision gets distorted and light effects eyes. - Entered by patient    HPI Jill [name] is a 28 y.o. female.   Patient presents today with a 3-week history of URI symptoms.  She reports that when her symptoms first began she went to a different urgent care and tested negative for COVID, flu, RSV.  She initially thought symptoms were improving only to have them occur approximately a week ago and worsened.  She reports ongoing headache, congestion, sinus pressure, cough, sputum production.  She denies any fever, chest pain, shortness of breath, weakness.  Reports that yesterday when she was coughing the pain around her eyes became excruciating and she was seeing dots.  She has been taking over-the-counter cold and flu medication as well as Tylenol ibuprofen without improvement of symptoms.  Denies any recent antibiotics or steroids.  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  Denies hospitalization related to asthma.  She does not smoke.  She has no concern for pregnancy.  She is having difficulty with daily duties as a result of symptoms.    Past Medical History:  Diagnosis Date   Medical history non-contributory    Protrusion of lumbar intervertebral disc     Patient Active Problem List   Diagnosis Date Noted   Nexplanon insertion 11/05/2022   Cesarean delivery delivered 11/19/2021   Pregnancy examination or test, negative result 07/23/2018    Past Surgical History:  Procedure Laterality Date   CESAREAN SECTION N/A 11/17/2021   Procedure: CESAREAN SECTION;  Surgeon: Myna Hidalgo, DO;  Location: [facility];  Service: Obstetrics;  Laterality: N/A;   WISDOM TOOTH  EXTRACTION Bilateral     OB History     Gravida  1   Para  1   Term  1   Preterm  0   AB  0   Living  1      SAB  0   IAB  0   Ectopic  0   Multiple  0   Live Births  1            Home Medications    Prior to Admission medications   Medication Sig Start Date End Date Taking? Authorizing Provider  amoxicillin-clavulanate (AUGMENTIN) 875-125 MG tablet Take 1 tablet by mouth every 12 (twelve) hours. 12/22/23  Yes Evalyse Stroope K, PA-C  predniSONE (DELTASONE) 20 MG tablet Take 2 tablets (40 mg total) by mouth daily for 4 days. 12/22/23 12/26/23 Yes Ginia Rudell K, PA-C  promethazine-dextromethorphan (PROMETHAZINE-DM) 6.25-15 MG/5ML syrup Take 5 mLs by mouth 3 (three) times daily as needed for cough. 12/22/23  Yes Kiyon Fidalgo, Noberto Retort, PA-C  HYDROcodone-acetaminophen (NORCO/VICODIN) 5-325 MG tablet Take 1 tablet by mouth every 4 (four) hours as needed. 09/10/22   Burgess Amor, PA-C  megestrol (MEGACE) 40 MG tablet Take 3 x 5 days then 2 x 5 days then 1 daily till bleeding stops 09/19/23   Cyril Mourning A, NP  meloxicam (MOBIC) 15 MG tablet Take 15 mg by mouth daily. 10/21/22   [provider]  methocarbamol (ROBAXIN) 500 MG tablet Take 500-1,000 mg by mouth  every 6 (six) hours as needed. 10/21/22   [provider]    Family History Family History  Problem Relation Age of Onset   Hypertension Paternal Grandfather    Cerebral aneurysm Paternal Grandfather    Cancer Paternal Grandmother    Alcohol abuse Maternal Grandfather    Diverticulitis Maternal Grandfather    Cancer Maternal Grandfather        lung   Gallbladder disease Father    Diverticulitis Mother    Gallbladder disease Sister    Von Willebrand disease Sister    Other Daughter        acid reflux    Social History Social History   Tobacco Use   Smoking status: Never   Smokeless tobacco: Never  Vaping Use   Vaping status: Never Used  Substance Use Topics   Alcohol use: Not Currently     Comment: occasional   Drug use: Never     Allergies   Patient has no known allergies.   Review of Systems Review of Systems  Constitutional:  Positive for activity change and fatigue. Negative for appetite change and fever.  HENT:  Positive for congestion, postnasal drip, sinus pressure and sinus pain. Negative for sneezing and sore throat.   Respiratory:  Positive for cough. Negative for shortness of breath.   Cardiovascular:  Negative for chest pain.  Gastrointestinal:  Negative for abdominal pain, diarrhea, nausea and vomiting.  Neurological:  Positive for headaches.     Physical Exam Triage Vital Signs ED Triage Vitals  Encounter Vitals Group     BP 12/22/23 1833 112/74     Systolic BP Percentile --      Diastolic BP Percentile --      Pulse Rate 12/22/23 1833 70     Resp 12/22/23 1833 16     Temp 12/22/23 1833 98.4 F (36.9 C)     Temp Source 12/22/23 1833 Oral     SpO2 12/22/23 1833 96 %     Weight --      Height --      Head Circumference --      Peak Flow --      Pain Score 12/22/23 1832 0     Pain Loc --      Pain Education --      Exclude from Growth Chart --    No data found.  Updated Vital Signs BP 112/74 (BP Location: Right Arm)   Pulse 70   Temp 98.4 F (36.9 C) (Oral)   Resp 16   LMP 12/10/2023 (Exact Date)   SpO2 96%   Visual Acuity Right Eye Distance:   Left Eye Distance:   Bilateral Distance:    Right Eye Near:   Left Eye Near:    Bilateral Near:     Physical Exam Vitals reviewed.  Constitutional:      General: She is awake. She is not in acute distress.    Appearance: Normal appearance. She is well-developed. She is not ill-appearing.     Comments: Very pleasant female appears stated age in no acute distress sitting comfortably in exam room  HENT:     Head: Normocephalic and atraumatic.     Right Ear: Ear canal and external ear normal. A middle ear effusion is present. Tympanic membrane is not erythematous or bulging.     Left  Ear: Ear canal and external ear normal. A middle ear effusion is present. Tympanic membrane is not erythematous or bulging.     Nose:  Right Sinus: Maxillary sinus tenderness present. No frontal sinus tenderness.     Left Sinus: Maxillary sinus tenderness present. No frontal sinus tenderness.     Mouth/Throat:     Pharynx: Uvula midline. Posterior oropharyngeal erythema and postnasal drip present. No oropharyngeal exudate.  Cardiovascular:     Rate and Rhythm: Normal rate and regular rhythm.     Heart sounds: Normal heart sounds, S1 normal and S2 normal. No murmur heard. Pulmonary:     Effort: Pulmonary effort is normal.     Breath sounds: Wheezing present. No rhonchi or rales.     Comments: Scattered wheezing Psychiatric:        Behavior: Behavior is cooperative.      UC Treatments / Results  Labs (all labs ordered are listed, but only abnormal results are displayed) Labs Reviewed - No data to display  EKG   Radiology No results found.  Procedures Procedures (including critical care time)  Medications Ordered in UC Medications  albuterol (VENTOLIN HFA) 108 (90 Base) MCG/ACT inhaler 2 puff (2 puffs Inhalation Given 12/22/23 1926)    Initial Impression / Assessment and Plan / UC Course  I have reviewed the triage vital signs and the nursing notes.  Pertinent labs & imaging results that were available during my care of the patient were reviewed by me and considered in my medical decision making (see chart for details).     Patient is well-appearing, afebrile, nontoxic, nontachycardic.  She initially had wheezing but was given albuterol in clinic with improvement of symptoms.  Since her symptoms resolved following dose of albuterol and her oxygen saturation was appropriate chest x-ray was deferred for the time being but we discussed that it would be reasonable to obtain if her symptoms are not improving within a week.  Given her prolonged and worsening symptoms will cover  for sinobronchitis.  She was started on Augmentin twice daily for the 7 days.  She was also given prednisone 40 mg for 4 days with instruction not to take NSAIDs with this medication due to risk of GI bleeding.  She can use Promethazine DM for cough.  We discussed that this can be sedating and she is not to drive or drink alcohol with taking it.  If her symptoms are not improving quickly or if anything worsen she is to be seen immediately.  Strict return precautions given.  Work excuse note provided.  Final Clinical Impressions(s) / UC Diagnoses   Final diagnoses:  Sinobronchitis  Wheezing     Discharge Instructions      I think you have a sinus/bronchitis infection.  Start Augmentin twice daily for 7 days.  Use the albuterol inhaler every 4-6 hours as needed.  You can use over-the-counter medications such as Mucinex, Tylenol, Flonase, nasal saline/sinus rinses for additional symptom relief.  Make sure you rest and drink plenty of fluid.  Take Promethazine DM for cough.  This will make you sleepy so do not drive or drink alcohol with taking it.  Make sure you rest and drink plenty of fluid.  If your symptoms are not improving within a week or if anything worsens and you have worsening cough, shortness of breath, chest pain, fever, nausea, vomiting you need to be seen immediately.     ED Prescriptions     Medication Sig Dispense Auth. Provider   amoxicillin-clavulanate (AUGMENTIN) 875-125 MG tablet Take 1 tablet by mouth every 12 (twelve) hours. 14 tablet Shaquon Gropp K, PA-C   predniSONE (DELTASONE) 20 MG tablet  Take 2 tablets (40 mg total) by mouth daily for 4 days. 8 tablet Kyrstan Gotwalt K, PA-C   promethazine-dextromethorphan (PROMETHAZINE-DM) 6.25-15 MG/5ML syrup Take 5 mLs by mouth 3 (three) times daily as needed for cough. 118 mL Shelvy Heckert K, PA-C      PDMP not reviewed this encounter.   Jeani Hawking, PA-C 12/22/23 1952

## 2023-12-22 NOTE — Discharge Instructions (Addendum)
I think you have a sinus/bronchitis infection.  Start Augmentin twice daily for 7 days.  Use the albuterol inhaler every 4-6 hours as needed.  You can use over-the-counter medications such as Mucinex, Tylenol, Flonase, nasal saline/sinus rinses for additional symptom relief.  Make sure you rest and drink plenty of fluid.  Take Promethazine DM for cough.  This will make you sleepy so do not drive or drink alcohol with taking it.  Make sure you rest and drink plenty of fluid.  If your symptoms are not improving within a week or if anything worsens and you have worsening cough, shortness of breath, chest pain, fever, nausea, vomiting you need to be seen immediately.

## 2023-12-22 NOTE — ED Triage Notes (Signed)
Pt reports a headache, congestion, sinus drainage, phlegm, x 3 weeks. Pt states yesterday she noticed the pain in her head became excruciating when coughing, and feeling of dizziness and blurred vision on yesterday.

## 2024-03-04 ENCOUNTER — Ambulatory Visit: Admitting: Obstetrics & Gynecology

## 2024-03-04 ENCOUNTER — Encounter: Payer: Self-pay | Admitting: Obstetrics & Gynecology

## 2024-03-04 VITALS — BP 120/70 | HR 77 | Ht 62.0 in | Wt 206.0 lb

## 2024-03-04 DIAGNOSIS — N939 Abnormal uterine and vaginal bleeding, unspecified: Secondary | ICD-10-CM | POA: Diagnosis not present

## 2024-03-04 DIAGNOSIS — Z3009 Encounter for other general counseling and advice on contraception: Secondary | ICD-10-CM | POA: Diagnosis not present

## 2024-03-04 DIAGNOSIS — Z3046 Encounter for surveillance of implantable subdermal contraceptive: Secondary | ICD-10-CM

## 2024-03-04 NOTE — Progress Notes (Signed)
   GYN VISIT Patient name: Jill [name] MRN [id number]  Date of birth: 1996-05-19 Chief Complaint:   Contraception (Nexplanon removal)  History of Present Illness:   Jill [name] is a 28 y.o. G23P1001 female being seen today for contraceptive management/nexplanon removal.     Currently has Nexplanon and notes AUB.  Typically monthly but would last for more than 2 weeks and would require Megace which would stop it.  Then skip a month then followed by a heavy period.  Without the Nexplanon- menses are monthly, but not as long typically last for about 5 days.  On a heavy day typically changing every 2-3 hours.  At this time she would welcome another pregnancy and wishes to have the Nexplanon removed  Currently on menses    Review of Systems:   Pertinent items are noted in HPI Denies fever/chills, dizziness, headaches, visual disturbances, fatigue, shortness of breath, chest pain, abdominal pain, vomiting Pertinent History Reviewed:   Past Surgical History:  Procedure Laterality Date   CESAREAN SECTION N/A 11/17/2021   Procedure: CESAREAN SECTION;  Surgeon: Myna Hidalgo, DO;  Location: [facility];  Service: Obstetrics;  Laterality: N/A;   WISDOM TOOTH EXTRACTION Bilateral     Past Medical History:  Diagnosis Date   Medical history non-contributory    Protrusion of lumbar intervertebral disc    Reviewed problem list, medications and allergies. Physical Assessment:   Vitals:   03/04/24 1136  BP: 120/70  Pulse: 77  Weight: 206 lb (93.4 kg)  Height: 5\' 2"  (1.575 m)  Body mass index is 37.68 kg/m.       Physical Examination:   General appearance: alert, well appearing, and in no distress  Psych: mood appropriate, normal affect  Skin: warm & dry   Cardiovascular: normal heart rate noted  Respiratory: normal respiratory effort, no distress  Abdomen: soft, non-tender   Pelvic: examination not indicated  Extremities: Left arm with palpable device   NEXPLANON  REMOVAL    Risks/benefits/side effects of Nexplanon have been discussed and her questions have been answered.  Specifically, a failure rate of 11/998 has been reported, with an increased failure rate if pt takes St. John's Wort and/or antiseizure medicaitons.  She is aware of the common side effect of irregular bleeding, which the incidence of decreases over time. Signed copy of informed consent in chart.    Nexplanon site identified.  Area prepped in usual sterile fashon. Two cc's of 2% lidocaine was used to anesthetize the area. A small stab incision was made right beside the implant on the distal portion.  The Nexplanon rod was grasped using hemostats and removed intact without difficulty.  Steri-strips and a pressure bandage was applied.  There was less than 3 cc blood loss. There were no complications.  The patient tolerated the procedure well.  Assessment & Plan:  1) Nexplanon removal, family-planning, heavy menstrual bleeding - Device removed without issues as above - Encouraged prenatal vitamin daily and patient to call once pregnant - May take ibuprofen during menses to help with bleeding.  Should she have further issues RTC  2) Preventive screening - Patient is due for Pap since she is on her menses which is to postpone until her next visit  No orders of the defined types were placed in this encounter.   Return for next available for annual.   Myna Hidalgo, DO Attending Obstetrician & Gynecologist, [facility] for [facility], [facility]

## 2024-03-04 NOTE — Addendum Note (Signed)
 Addended by: Christel Cousins on: 03/04/2024 02:52 PM   Modules accepted: Level of Service

## 2024-03-16 ENCOUNTER — Encounter: Admitting: Adult Health

## 2024-03-18 ENCOUNTER — Ambulatory Visit: Admitting: Advanced Practice Midwife

## 2024-03-22 ENCOUNTER — Encounter: Payer: Self-pay | Admitting: Advanced Practice Midwife

## 2024-03-22 ENCOUNTER — Other Ambulatory Visit (HOSPITAL_COMMUNITY)
Admission: RE | Admit: 2024-03-22 | Discharge: 2024-03-22 | Disposition: A | Discharge status: Home / Self Care | Source: Ambulatory Visit | Attending: Advanced Practice Midwife | Admitting: Advanced Practice Midwife

## 2024-03-22 ENCOUNTER — Ambulatory Visit: Admitting: Advanced Practice Midwife

## 2024-03-22 VITALS — BP 112/74 | HR 97 | Ht 62.0 in | Wt 208.0 lb

## 2024-03-22 DIAGNOSIS — Z01419 Encounter for gynecological examination (general) (routine) without abnormal findings: Secondary | ICD-10-CM | POA: Diagnosis present

## 2024-03-22 NOTE — Addendum Note (Signed)
 Addended by: Myrl Askew on: 03/22/2024 04:16 PM   Modules accepted: Orders

## 2024-03-22 NOTE — Progress Notes (Signed)
 WELL-WOMAN EXAMINATION Patient name: Jill [name] MRN [id number]  Date of birth: January 22, 1996 Chief Complaint:   Annual Exam (Abnormal bleeding)  History of Present Illness:   Jill [name] is a 28 y.o. G106P1001 Caucasian female being seen today for a routine well-woman exam.  Current complaints: still having some AUB after Nexplanon  removed 4/17, but overall okay  PCP: [facility] Reichert or Urgent Care      does not desire labs Patient's last menstrual period was 02/26/2024. The current method of family planning is condoms.  Last pap June 2021. Results were: NILM w/ HRHPV not done. H/O abnormal pap: no Last mammogram: never. Results were: N/A. Family h/o breast cancer: no Last colonoscopy: never. Results were: N/A. Family h/o colorectal cancer: no     03/22/2024    3:43 PM 08/21/2021    8:50 AM 05/11/2021   10:01 AM 04/20/2020   11:17 AM 04/08/2018    3:43 PM  Depression screen PHQ 2/9  Decreased Interest 0 0 0 0 0  Down, Depressed, Hopeless 0 0 0 0 0  PHQ - 2 Score 0 0 0 0 0  Altered sleeping 0 1 0 1   Tired, decreased energy 1 1 1 1    Change in appetite 0 0 0 0   Feeling bad or failure about yourself  0 0 0 0   Trouble concentrating 0 0 0 0   Moving slowly or fidgety/restless 0 0 0 0   Suicidal thoughts 0 0 0 0   PHQ-9 Score 1 2 1 2          03/22/2024    3:50 PM 08/21/2021    8:50 AM 04/20/2020   11:17 AM  GAD 7 : Generalized Anxiety Score  Nervous, Anxious, on Edge 0 0 0  Control/stop worrying 0 1 0  Worry too much - different things 0 1 0  Trouble relaxing 1 0 1  Restless 0 0 0  Easily annoyed or irritable 1 1 2   Afraid - awful might happen 0 0 0  Total GAD 7 Score 2 3 3      Review of Systems:   Pertinent items are noted in HPI Denies any headaches, blurred vision, fatigue, shortness of breath, chest pain, abdominal pain, abnormal vaginal discharge/itching/odor/irritation, problems with periods, bowel movements, urination, or intercourse unless otherwise stated  above. Pertinent History Reviewed:  Reviewed past medical,surgical, social and family history.  Reviewed problem list, medications and allergies. Physical Assessment:   Vitals:   03/22/24 1547  BP: 112/74  Pulse: 97  Weight: 208 lb (94.3 kg)  Height: 5\' 2"  (1.575 m)  Body mass index is 38.04 kg/m.        Physical Examination:   General appearance - well appearing, and in no distress  Mental status - alert, oriented to person, place, and time  Psych:  She has a normal mood and affect  Skin - warm and dry, normal color, no suspicious lesions noted  Chest - effort normal, all lung fields clear to auscultation bilaterally  Heart - normal rate and regular rhythm  Neck:  midline trachea, no thyromegaly or nodules  Breasts - not examined; no concerns  Abdomen - soft, nontender, nondistended, no masses or organomegaly  Pelvic - VULVA: normal appearing vulva with no masses, tenderness or lesions  VAGINA: normal appearing vagina with normal color and discharge, no lesions  CERVIX: normal appearing cervix without discharge or lesions, no CMT  Thin prep pap is done with HR HPV cotesting  UTERUS:  uterus is felt to be normal size, shape, consistency and nontender   ADNEXA: No adnexal masses or tenderness noted.  Rectal - not examined  Extremities:  No swelling or varicosities noted  Chaperone: Peggy Dones  No results found for this or any previous visit (from the past 24 hours).  Assessment & Plan:  1) Well-Woman Exam  2) AUB, just had Nexplanon  removed; taking Megace  prn but it is getting a little better  Labs/procedures today: Pap  Mammogram: @ 26yo, or sooner if problems Colonoscopy: @ 26yo, or sooner if problems  No orders of the defined types were placed in this encounter.   Meds: No orders of the defined types were placed in this encounter.   Follow-up: Return in about 1 year (around 03/22/2025) for Physical.  Jolayne Natter [facility] 03/22/2024 4:09 PM

## 2024-03-24 ENCOUNTER — Encounter: Payer: Self-pay | Admitting: Advanced Practice Midwife

## 2024-03-24 LAB — CYTOLOGY - PAP
Comment: NEGATIVE
Diagnosis: NEGATIVE
High risk HPV: NEGATIVE

## 2024-09-15 ENCOUNTER — Ambulatory Visit (INDEPENDENT_AMBULATORY_CARE_PROVIDER_SITE_OTHER): Admitting: *Deleted

## 2024-09-15 DIAGNOSIS — Z3201 Encounter for pregnancy test, result positive: Secondary | ICD-10-CM

## 2024-09-15 DIAGNOSIS — N926 Irregular menstruation, unspecified: Secondary | ICD-10-CM

## 2024-09-15 LAB — POCT URINE PREGNANCY: Preg Test, Ur: POSITIVE — AB

## 2024-09-15 NOTE — Progress Notes (Signed)
   NURSE VISIT- PREGNANCY CONFIRMATION   SUBJECTIVE:  Jill [name] is a 28 y.o. G43P1001 female at [redacted]w[redacted]d by certain LMP of Patient's last menstrual period was 08/07/2024. Here for pregnancy confirmation.  Home pregnancy test: positive x 2  She reports cramping.  She is not taking prenatal vitamins.    OBJECTIVE:  LMP 08/07/2024   Appears well, in no apparent distress  No results found for this or any previous visit (from the past 24 hours).  ASSESSMENT: Positive pregnancy test, [redacted]w[redacted]d by LMP    PLAN: Schedule for dating ultrasound in 2 week Prenatal vitamins: plans to begin OTC ASAP   Nausea medicines: not currently needed   OB packet given: Yes  Alan LITTIE Fischer  09/15/2024 9:38 AM

## 2024-09-27 ENCOUNTER — Other Ambulatory Visit: Payer: Self-pay | Admitting: Obstetrics & Gynecology

## 2024-09-27 DIAGNOSIS — O3680X0 Pregnancy with inconclusive fetal viability, not applicable or unspecified: Secondary | ICD-10-CM

## 2024-09-29 ENCOUNTER — Ambulatory Visit (INDEPENDENT_AMBULATORY_CARE_PROVIDER_SITE_OTHER)

## 2024-09-29 DIAGNOSIS — O3680X0 Pregnancy with inconclusive fetal viability, not applicable or unspecified: Secondary | ICD-10-CM | POA: Diagnosis not present

## 2024-09-29 DIAGNOSIS — Z3A01 Less than 8 weeks gestation of pregnancy: Secondary | ICD-10-CM

## 2024-09-29 NOTE — Progress Notes (Signed)
 US  6+3 wks,single IUP with yolk sac,CRL 6.06 mm,FHR 119 bpm,normal ovaries

## 2024-10-04 ENCOUNTER — Encounter: Payer: Self-pay | Admitting: Obstetrics & Gynecology

## 2024-10-05 ENCOUNTER — Telehealth: Payer: Self-pay

## 2024-10-05 NOTE — Telephone Encounter (Signed)
 Left message on voicemail about accommodation forms being ready.

## 2024-10-29 ENCOUNTER — Ambulatory Visit: Payer: Self-pay

## 2024-11-01 ENCOUNTER — Other Ambulatory Visit: Payer: Self-pay | Admitting: Women's Health

## 2024-11-01 ENCOUNTER — Encounter: Payer: Self-pay | Admitting: Women's Health

## 2024-11-01 MED ORDER — DOXYLAMINE-PYRIDOXINE 10-10 MG PO TBEC: DELAYED_RELEASE_TABLET | ORAL | 6 refills | Status: AC

## 2024-11-04 ENCOUNTER — Encounter: Payer: Self-pay | Admitting: Women's Health

## 2024-11-04 DIAGNOSIS — Z3490 Encounter for supervision of normal pregnancy, unspecified, unspecified trimester: Secondary | ICD-10-CM | POA: Insufficient documentation

## 2024-11-08 ENCOUNTER — Encounter: Admitting: *Deleted

## 2024-11-08 ENCOUNTER — Encounter: Payer: Self-pay | Admitting: Women's Health

## 2024-11-08 ENCOUNTER — Ambulatory Visit: Admitting: Women's Health

## 2024-11-08 ENCOUNTER — Other Ambulatory Visit (HOSPITAL_COMMUNITY)
Admission: RE | Admit: 2024-11-08 | Discharge: 2024-11-08 | Disposition: A | Discharge status: Home / Self Care | Source: Ambulatory Visit | Attending: Women's Health | Admitting: Women's Health

## 2024-11-08 VITALS — BP 107/71 | HR 103 | Wt 200.4 lb

## 2024-11-08 DIAGNOSIS — Z113 Encounter for screening for infections with a predominantly sexual mode of transmission: Secondary | ICD-10-CM | POA: Diagnosis present

## 2024-11-08 DIAGNOSIS — Z3480 Encounter for supervision of other normal pregnancy, unspecified trimester: Secondary | ICD-10-CM

## 2024-11-08 DIAGNOSIS — Z1332 Encounter for screening for maternal depression: Secondary | ICD-10-CM | POA: Diagnosis not present

## 2024-11-08 DIAGNOSIS — Z6838 Body mass index (BMI) 38.0-38.9, adult: Secondary | ICD-10-CM

## 2024-11-08 DIAGNOSIS — Z3A12 12 weeks gestation of pregnancy: Secondary | ICD-10-CM

## 2024-11-08 DIAGNOSIS — Z3481 Encounter for supervision of other normal pregnancy, first trimester: Secondary | ICD-10-CM

## 2024-11-08 DIAGNOSIS — O34219 Maternal care for unspecified type scar from previous cesarean delivery: Secondary | ICD-10-CM | POA: Diagnosis not present

## 2024-11-08 LAB — POCT URINALYSIS DIPSTICK OB
Blood, UA: NEGATIVE
Glucose, UA: NEGATIVE
Ketones, UA: NEGATIVE
Leukocytes, UA: NEGATIVE
Nitrite, UA: NEGATIVE
POC,PROTEIN,UA: NEGATIVE

## 2024-11-08 MED ORDER — BLOOD PRESSURE CUFF MISC: 1.0000 | 0 refills | Status: AC

## 2024-11-08 NOTE — Patient Instructions (Signed)
Jill [name], thank you for choosing our office today! We appreciate the opportunity to meet your healthcare needs. You may receive a short survey by mail, e-mail, or through [employer]. If you are happy with your care we would appreciate if you could take just a few minutes to complete the survey questions. We read all of your comments and take your feedback very seriously. Thank you again for choosing our office.  Center for [employer] [facility] Team at [facility]  [facility] at [facility] ([address] [place], [place] [address]) Entrance C, located off of [address] Free 24/7 valet parking   Nausea & Vomiting Have saltine crackers or pretzels by your bed and eat a few bites before you raise your head out of bed in the morning Eat small frequent meals throughout the day instead of large meals Drink plenty of fluids throughout the day to stay hydrated, just don't drink a lot of fluids with your meals.  This can make your stomach fill up faster making you feel sick Do not brush your teeth right after you eat Products with real ginger are good for nausea, like ginger ale and ginger hard candy Make sure it says made with real ginger! Sucking on sour candy like lemon heads is also good for nausea If your prenatal vitamins make you nauseated, take them at night so you will sleep through the nausea Sea Bands If you feel like you need medicine for the nausea & vomiting please let us know If you are unable to keep any fluids or food down please let us know   Constipation Drink plenty of fluid, preferably water, throughout the day Eat foods high in fiber such as fruits, vegetables, and grains Exercise, such as walking, is a good way to keep your bowels regular Drink warm fluids, especially warm prune juice, or decaf coffee Eat a 1/2 cup of real oatmeal (not instant), 1/2 cup applesauce, and 1/2-1 cup warm prune juice every day If needed, you may take Colace (docusate sodium) stool softener  once or twice a day to help keep the stool soft.  If you still are having problems with constipation, you may take Miralax once daily as needed to help keep your bowels regular.   Home Blood Pressure Monitoring for Patients   Your provider has recommended that you check your blood pressure (BP) at least once a week at home. If you do not have a blood pressure cuff at home, one will be provided for you. Contact your provider if you have not received your monitor within 1 week.   Helpful Tips for Accurate Home Blood Pressure Checks  Don't smoke, exercise, or drink caffeine 30 minutes before checking your BP Use the restroom before checking your BP (a full bladder can raise your pressure) Relax in a comfortable upright chair Feet on the ground Left arm resting comfortably on a flat surface at the level of your heart Legs uncrossed Back supported Sit quietly and don't talk Place the cuff on your bare arm Adjust snuggly, so that only two fingertips can fit between your skin and the top of the cuff Check 2 readings separated by at least one minute Keep a log of your BP readings For a visual, please reference this diagram: http://ccnc.care/bpdiagram  Provider Name: [facility]     Phone: [phone]  Zone 1: ALL CLEAR  Continue to monitor your symptoms:  BP reading is less than 140 (top number) or less than 90 (bottom  number)  No right upper stomach pain No headaches or seeing spots No feeling nauseated or throwing up No swelling in face and hands  Zone 2: CAUTION Call your doctor's office for any of the following:  BP reading is greater than 140 (top number) or greater than 90 (bottom number)  Stomach pain under your ribs in the middle or right side Headaches or seeing spots Feeling nauseated or throwing up Swelling in face and hands  Zone 3: EMERGENCY  Seek immediate medical care if you have any of the following:  BP reading is greater than160 (top number) or greater than  110 (bottom number) Severe headaches not improving with Tylenol Serious difficulty catching your breath Any worsening symptoms from Zone 2    First Trimester of Pregnancy The first trimester of pregnancy is from week 1 until the end of week 12 (months 1 through 3). A week after a sperm fertilizes an egg, the egg will implant on the wall of the uterus. This embryo will begin to develop into a baby. Genes from you and your partner are forming the baby. The female genes determine whether the baby is a boy or a girl. At 6-8 weeks, the eyes and face are formed, and the heartbeat can be seen on ultrasound. At the end of 12 weeks, all the baby's organs are formed.  Now that you are pregnant, you will want to do everything you can to have a healthy baby. Two of the most important things are to get good prenatal care and to follow your health care provider's instructions. Prenatal care is all the medical care you receive before the baby's birth. This care will help prevent, find, and treat any problems during the pregnancy and childbirth. BODY CHANGES Your body goes through many changes during pregnancy. The changes vary from woman to woman.  You may gain or lose a couple of pounds at first. You may feel sick to your stomach (nauseous) and throw up (vomit). If the vomiting is uncontrollable, call your health care provider. You may tire easily. You may develop headaches that can be relieved by medicines approved by your health care provider. You may urinate more often. Painful urination may mean you have a bladder infection. You may develop heartburn as a result of your pregnancy. You may develop constipation because certain hormones are causing the muscles that push waste through your intestines to slow down. You may develop hemorrhoids or swollen, bulging veins (varicose veins). Your breasts may begin to grow larger and become tender. Your nipples may stick out more, and the tissue that surrounds them  (areola) may become darker. Your gums may bleed and may be sensitive to brushing and flossing. Dark spots or blotches (chloasma, mask of pregnancy) may develop on your face. This will likely fade after the baby is born. Your menstrual periods will stop. You may have a loss of appetite. You may develop cravings for certain kinds of food. You may have changes in your emotions from day to day, such as being excited to be pregnant or being concerned that something may go wrong with the pregnancy and baby. You may have more vivid and strange dreams. You may have changes in your hair. These can include thickening of your hair, rapid growth, and changes in texture. Some women also have hair loss during or after pregnancy, or hair that feels dry or thin. Your hair will most likely return to normal after your baby is born. WHAT TO EXPECT AT YOUR PRENATAL  VISITS During a routine prenatal visit: You will be weighed to make sure you and the baby are growing normally. Your blood pressure will be taken. Your abdomen will be measured to track your baby's growth. The fetal heartbeat will be listened to starting around week 10 or 12 of your pregnancy. Test results from any previous visits will be discussed. Your health care provider may ask you: How you are feeling. If you are feeling the baby move. If you have had any abnormal symptoms, such as leaking fluid, bleeding, severe headaches, or abdominal cramping. If you have any questions. Other tests that may be performed during your first trimester include: Blood tests to find your blood type and to check for the presence of any previous infections. They will also be used to check for low iron levels (anemia) and Rh antibodies. Later in the pregnancy, blood tests for diabetes will be done along with other tests if problems develop. Urine tests to check for infections, diabetes, or protein in the urine. An ultrasound to confirm the proper growth and development  of the baby. An amniocentesis to check for possible genetic problems. Fetal screens for spina bifida and Down syndrome. You may need other tests to make sure you and the baby are doing well. HOME CARE INSTRUCTIONS  Medicines Follow your health care provider's instructions regarding medicine use. Specific medicines may be either safe or unsafe to take during pregnancy. Take your prenatal vitamins as directed. If you develop constipation, try taking a stool softener if your health care provider approves. Diet Eat regular, well-balanced meals. Choose a variety of foods, such as meat or vegetable-based protein, fish, milk and low-fat dairy products, vegetables, fruits, and whole grain breads and cereals. Your health care provider will help you determine the amount of weight gain that is right for you. Avoid raw meat and uncooked cheese. These carry germs that can cause birth defects in the baby. Eating four or five small meals rather than three large meals a day may help relieve nausea and vomiting. If you start to feel nauseous, eating a few soda crackers can be helpful. Drinking liquids between meals instead of during meals also seems to help nausea and vomiting. If you develop constipation, eat more high-fiber foods, such as fresh vegetables or fruit and whole grains. Drink enough fluids to keep your urine clear or pale yellow. Activity and Exercise Exercise only as directed by your health care provider. Exercising will help you: Control your weight. Stay in shape. Be prepared for labor and delivery. Experiencing pain or cramping in the lower abdomen or low back is a good sign that you should stop exercising. Check with your health care provider before continuing normal exercises. Try to avoid standing for long periods of time. Move your legs often if you must stand in one place for a long time. Avoid heavy lifting. Wear low-heeled shoes, and practice good posture. You may continue to have sex  unless your health care provider directs you otherwise. Relief of Pain or Discomfort Wear a good support bra for breast tenderness.   Take warm sitz baths to soothe any pain or discomfort caused by hemorrhoids. Use hemorrhoid cream if your health care provider approves.   Rest with your legs elevated if you have leg cramps or low back pain. If you develop varicose veins in your legs, wear support hose. Elevate your feet for 15 minutes, 3-4 times a day. Limit salt in your diet. Prenatal Care Schedule your prenatal visits by the  twelfth week of pregnancy. They are usually scheduled monthly at first, then more often in the last 2 months before delivery. Write down your questions. Take them to your prenatal visits. Keep all your prenatal visits as directed by your health care provider. Safety Wear your seat belt at all times when driving. Make a list of emergency phone numbers, including numbers for family, friends, the hospital, and police and fire departments. General Tips Ask your health care provider for a referral to a local prenatal education class. Begin classes no later than at the beginning of month 6 of your pregnancy. Ask for help if you have counseling or nutritional needs during pregnancy. Your health care provider can offer advice or refer you to specialists for help with various needs. Do not use hot tubs, steam rooms, or saunas. Do not douche or use tampons or scented sanitary pads. Do not cross your legs for long periods of time. Avoid cat litter boxes and soil used by cats. These carry germs that can cause birth defects in the baby and possibly loss of the fetus by miscarriage or stillbirth. Avoid all smoking, herbs, alcohol, and medicines not prescribed by your health care provider. Chemicals in these affect the formation and growth of the baby. Schedule a dentist appointment. At home, brush your teeth with a soft toothbrush and be gentle when you floss. SEEK MEDICAL CARE IF:   You have dizziness. You have mild pelvic cramps, pelvic pressure, or nagging pain in the abdominal area. You have persistent nausea, vomiting, or diarrhea. You have a bad smelling vaginal discharge. You have pain with urination. You notice increased swelling in your face, hands, legs, or ankles. SEEK IMMEDIATE MEDICAL CARE IF:  You have a fever. You are leaking fluid from your vagina. You have spotting or bleeding from your vagina. You have severe abdominal cramping or pain. You have rapid weight gain or loss. You vomit blood or material that looks like coffee grounds. You are exposed to [place] measles and have never had them. You are exposed to fifth disease or chickenpox. You develop a severe headache. You have shortness of breath. You have any kind of trauma, such as from a fall or a car accident. Document Released: 10/29/2001 Document Revised: 03/21/2014 Document Reviewed: 09/14/2013 [facility] Patient Information 2015 [place], [place]. This information is not intended to replace advice given to you by your health care provider. Make sure you discuss any questions you have with your health care provider.

## 2024-11-08 NOTE — Progress Notes (Signed)
 "   INITIAL OBSTETRICAL VISIT Patient name: Jill [name] MRN [id number]  Date of birth: May 17, 1996 Chief Complaint:   Initial Prenatal Visit  History of Present Illness:   Jill [name] is a 28 y.o. G36P1001 Caucasian female at [redacted]w[redacted]d by US  at 6 weeks with an Estimated Date of Delivery: 05/22/25 being seen today for her initial obstetrical visit.   Patient's last menstrual period was 08/07/2024. Her obstetrical history is significant for C/S for FTD, 8lb10oz, OP.   Today she reports nausea, diclegis  helps.  Last pap 03/22/24. Results were: NILM w/ HRHPV negative     11/08/2024    3:18 PM 03/22/2024    3:43 PM 08/21/2021    8:50 AM 05/11/2021   10:01 AM 04/20/2020   11:17 AM  Depression screen PHQ 2/9  Decreased Interest 0 0 0 0 0  Down, Depressed, Hopeless 0 0 0 0 0  PHQ - 2 Score 0 0 0 0 0  Altered sleeping 0 0 1 0 1  Tired, decreased energy 1 1 1 1 1   Change in appetite 1 0 0 0 0  Feeling bad or failure about yourself  0 0 0 0 0  Trouble concentrating 0 0 0 0 0  Moving slowly or fidgety/restless 0 0 0 0 0  Suicidal thoughts 0 0 0 0 0  PHQ-9 Score 2 1  2  1  2       Data saved with a previous flowsheet row definition        11/08/2024    3:18 PM 03/22/2024    3:50 PM 08/21/2021    8:50 AM 04/20/2020   11:17 AM  GAD 7 : Generalized Anxiety Score  Nervous, Anxious, on Edge 0 0 0 0  Control/stop worrying 0 0 1 0  Worry too much - different things 0 0 1 0  Trouble relaxing 0 1 0 1  Restless 0 0 0 0  Easily annoyed or irritable 1 1 1 2   Afraid - awful might happen 0 0 0 0  Total GAD 7 Score 1 2 3 3      Review of Systems:   Pertinent items are noted in HPI Denies cramping/contractions, leakage of fluid, vaginal bleeding, abnormal vaginal discharge w/ itching/odor/irritation, headaches, visual changes, shortness of breath, chest pain, abdominal pain, severe nausea/vomiting, or problems with urination or bowel movements unless otherwise stated above.  Pertinent History Reviewed:   Reviewed past medical,surgical, social, obstetrical and family history.  Reviewed problem list, medications and allergies. OB History  Gravida Para Term Preterm AB Living  2 1 1  0 0 1  SAB IAB Ectopic Multiple Live Births  0 0 0 0 1    # Outcome Date GA Lbr Len/2nd Weight Sex Type Anes PTL Lv  2 Current           1 Term 11/17/21 [redacted]w[redacted]d 15:27 / 04:12 8 lb 10.1 oz (3.915 kg) F CS-LTranv EPI  LIV   Physical Assessment:   Vitals:   11/08/24 1522  BP: 107/71  Pulse: (!) 103  Weight: 200 lb 6.4 oz (90.9 kg)  Body mass index is 36.65 kg/m.       Physical Examination:  General appearance - well appearing, and in no distress  Mental status - alert, oriented to person, place, and time  Psych:  She has a normal mood and affect  Skin - warm and dry, normal color, no suspicious lesions noted  Chest - effort normal, all lung fields clear to auscultation  bilaterally  Heart - normal rate and regular rhythm  Abdomen - soft, nontender  Extremities:  No swelling or varicosities noted  Thin prep pap is not done   Chaperone: N/A  TODAY'S informal TA u/s: +FCA and active fetus  Results for orders placed or performed in visit on 11/08/24 (from the past 24 hours)  POC Urinalysis Dipstick OB   Collection Time: 11/08/24  3:39 PM  Result Value Ref Range   Color, UA     Clarity, UA     Glucose, UA Negative Negative   Bilirubin, UA     Ketones, UA neg    Spec Grav, UA     Blood, UA neg    pH, UA     POC,PROTEIN,UA Negative Negative, Trace, Small (1+), Moderate (2+), Large (3+), 4+   Urobilinogen, UA     Nitrite, UA neg    Leukocytes, UA Negative Negative   Appearance     Odor      Assessment & Plan:  1) Low-Risk Pregnancy G2P1001 at [redacted]w[redacted]d with an Estimated Date of Delivery: 05/22/25   2) Initial OB visit  3) Prev c/s> FTD/OP, 8lb10oz, gave TOLAC consent, leaning towards RCS  Meds:  Meds ordered this encounter  Medications   Blood Pressure Monitoring (BLOOD PRESSURE CUFF) MISC     Sig: 1 kit by Does not apply route as directed.    Dispense:  1 each    Refill:  0    Initial labs obtained Continue prenatal vitamins Reviewed n/v relief measures and warning s/s to report Reviewed recommended weight gain based on pre-gravid BMI Encouraged well-balanced diet Genetic & carrier screening discussed: requests Panorama and AFP,  Horizon  neg prev preg Ultrasound discussed; fetal survey: requested CCNC completed> form faxed if has or is planning to apply for medicaid The nature of [facility] [employer] with multiple MDs and other Advanced Practice Providers was explained to patient; also emphasized that fellows, residents, and students are part of our team. Does not have home bp cuff. Office bp cuff given: no. Rx sent: yes. Check bp weekly, let us  know if consistently >140/90.   Follow-up: Return in about 4 weeks (around 12/06/2024) for LROB, CNM, in person; then @ 20w for anatomy u/s and LROB w/ CNM.   Orders Placed This Encounter  Procedures   Urine Culture   PANORAMA PRENATAL TEST   CBC/D/Plt+RPR+Rh+ABO+RubIgG...   Hemoglobin A1c   POC Urinalysis Dipstick OB    Suzen JONELLE Fetters CNM, [facility] 11/08/2024 3:47 PM  "

## 2024-11-09 ENCOUNTER — Encounter: Payer: Self-pay | Admitting: Women's Health

## 2024-11-09 LAB — CBC/D/PLT+RPR+RH+ABO+RUBIGG...
Antibody Screen: NEGATIVE
Basophils Absolute: 0 x10E3/uL (ref 0.0–0.2)
Basos: 0 %
EOS (ABSOLUTE): 0 x10E3/uL (ref 0.0–0.4)
Eos: 0 %
HCV Ab: NONREACTIVE
HIV Screen 4th Generation wRfx: NONREACTIVE
Hematocrit: 39.3 % (ref 34.0–46.6)
Hemoglobin: 14.4 g/dL (ref 11.1–15.9)
Hepatitis B Surface Ag: NEGATIVE
Immature Grans (Abs): 0 x10E3/uL (ref 0.0–0.1)
Immature Granulocytes: 0 %
Lymphocytes Absolute: 0.7 x10E3/uL (ref 0.7–3.1)
Lymphs: 9 %
MCH: 32.7 pg (ref 26.6–33.0)
MCHC: 36.6 g/dL — ABNORMAL HIGH (ref 31.5–35.7)
MCV: 89 fL (ref 79–97)
Monocytes Absolute: 0.4 x10E3/uL (ref 0.1–0.9)
Monocytes: 5 %
Neutrophils Absolute: 6.5 x10E3/uL (ref 1.4–7.0)
Neutrophils: 86 %
Platelets: 237 x10E3/uL (ref 150–450)
RBC: 4.4 x10E6/uL (ref 3.77–5.28)
RDW: 13.2 % (ref 11.7–15.4)
RPR Ser Ql: NONREACTIVE
Rh Factor: POSITIVE
Rubella Antibodies, IGG: 1.22 {index}
WBC: 7.6 x10E3/uL (ref 3.4–10.8)

## 2024-11-09 LAB — HEMOGLOBIN A1C
Est. average glucose Bld gHb Est-mCnc: 97 mg/dL
Hgb A1c MFr Bld: 5 % (ref 4.8–5.6)

## 2024-11-09 LAB — HCV INTERPRETATION

## 2024-11-09 LAB — CERVICOVAGINAL ANCILLARY ONLY
Chlamydia: NEGATIVE
Comment: NEGATIVE
Comment: NORMAL
Neisseria Gonorrhea: NEGATIVE

## 2024-11-10 ENCOUNTER — Ambulatory Visit
Admission: EM | Admit: 2024-11-10 | Discharge: 2024-11-10 | Disposition: A | Discharge status: Home / Self Care | Attending: Nurse Practitioner | Admitting: Nurse Practitioner

## 2024-11-10 DIAGNOSIS — J101 Influenza due to other identified influenza virus with other respiratory manifestations: Secondary | ICD-10-CM

## 2024-11-10 LAB — POCT INFLUENZA A/B
Influenza A, POC: POSITIVE — AB
Influenza B, POC: NEGATIVE

## 2024-11-10 LAB — URINE CULTURE: Organism ID, Bacteria: NO GROWTH

## 2024-11-10 MED ORDER — FLUTICASONE PROPIONATE 50 MCG/ACT NA SUSP: 2.0000 | Freq: Every day | NASAL | 0 refills | Status: AC

## 2024-11-10 NOTE — ED Provider Notes (Signed)
 " [facility]    CSN: [id number] Arrival date & time: 11/10/24  1417      History   Chief Complaint No chief complaint on file.   HPI Jill [name] is a 28 y.o. female.   The history is provided by the patient.   Patient presents with a 4-day history of fever, generalized body aches, and cough.  Tmax between 0100-0101.  Denies headache, ear pain, ear drainage, wheezing, difficulty breathing, abdominal pain, nausea, vomiting, diarrhea, or rash.  Mother reports she has been taking over-the-counter Tylenol  for her symptoms.  Her daughter presents with the same or similar symptoms.  Mother reports she is approximately [redacted] weeks pregnant.  Past Medical History:  Diagnosis Date   Medical history non-contributory    Protrusion of lumbar intervertebral disc     Patient Active Problem List   Diagnosis Date Noted   Encounter for supervision of normal pregnancy, antepartum 11/04/2024   Cesarean delivery delivered 11/19/2021    Past Surgical History:  Procedure Laterality Date   CESAREAN SECTION N/A 11/17/2021   Procedure: CESAREAN SECTION;  Surgeon: Marilynn Nest, DO;  Location: [facility] LD ORS;  Service: Obstetrics;  Laterality: N/A;   WISDOM TOOTH EXTRACTION Bilateral     OB History     Gravida  2   Para  1   Term  1   Preterm  0   AB  0   Living  1      SAB  0   IAB  0   Ectopic  0   Multiple  0   Live Births  1            Home Medications    Prior to Admission medications  Medication Sig Start Date End Date Taking? Authorizing Provider  fluticasone  (FLONASE ) 50 MCG/ACT nasal spray Place 2 sprays into both nostrils daily. 11/10/24  Yes Leath-Warren, Etta PARAS, NP  Blood Pressure Monitoring (BLOOD PRESSURE CUFF) MISC 1 kit by Does not apply route as directed. 11/08/24   Kizzie Suzen SAUNDERS, CNM  Doxylamine -Pyridoxine  (DICLEGIS ) 10-10 MG TBEC 2 tabs q hs, if sx persist add 1 tab q am on day 3, if sx persist add 1 tab q afternoon on day 4  11/01/24   Kizzie Suzen SAUNDERS, CNM  Prenatal Vit-Fe Fumarate-FA (MULTIVITAMIN-PRENATAL) 27-0.8 MG TABS tablet Take 1 tablet by mouth daily at 12 noon.    [provider]    Family History Family History  Problem Relation Age of Onset   Hypertension Paternal Grandfather    Cerebral aneurysm Paternal Grandfather    Cancer Paternal Grandmother    Alcohol abuse Maternal Grandfather    Diverticulitis Maternal Grandfather    Cancer Maternal Grandfather        lung   Gallbladder disease Father    Diabetes Mother    Diverticulitis Mother    Gallbladder disease Sister    Von Willebrand disease Sister    Other Daughter        acid reflux   Melanoma Maternal Aunt     Social History Social History[1]   Allergies   Latex   Review of Systems Review of Systems Per HPI  Physical Exam Triage Vital Signs ED Triage Vitals  Encounter Vitals Group     BP 11/10/24 1438 101/71     Girls Systolic BP Percentile --      Girls Diastolic BP Percentile --      Boys Systolic BP Percentile --  Boys Diastolic BP Percentile --      Pulse Rate 11/10/24 1438 (!) 108     Resp 11/10/24 1438 16     Temp 11/10/24 1438 98.6 F (37 C)     Temp Source 11/10/24 1438 Oral     SpO2 11/10/24 1438 98 %     Weight --      Height --      Head Circumference --      Peak Flow --      Pain Score 11/10/24 1437 5     Pain Loc --      Pain Education --      Exclude from Growth Chart --    No data found.  Updated Vital Signs BP 101/71 (BP Location: Right Arm)   Pulse (!) 108   Temp 98.6 F (37 C) (Oral)   Resp 16   LMP 08/07/2024   SpO2 98%   Visual Acuity Right Eye Distance:   Left Eye Distance:   Bilateral Distance:    Right Eye Near:   Left Eye Near:    Bilateral Near:     Physical Exam Vitals and nursing note reviewed.  Constitutional:      General: She is not in acute distress.    Appearance: Normal appearance. She is well-developed.  HENT:     Head: Normocephalic and  atraumatic.     Right Ear: Tympanic membrane, ear canal and external ear normal.     Left Ear: Tympanic membrane, ear canal and external ear normal.     Nose: Congestion present.     Right Turbinates: Enlarged and swollen.     Left Turbinates: Enlarged and swollen.     Right Sinus: No maxillary sinus tenderness or frontal sinus tenderness.     Left Sinus: No maxillary sinus tenderness or frontal sinus tenderness.     Mouth/Throat:     Lips: Pink.     Mouth: Mucous membranes are moist.     Pharynx: Uvula midline. Postnasal drip present. No pharyngeal swelling, oropharyngeal exudate, posterior oropharyngeal erythema or uvula swelling.     Comments: Cobblestoning present to posterior oropharynx  Eyes:     Extraocular Movements: Extraocular movements intact.     Conjunctiva/sclera: Conjunctivae normal.     Pupils: Pupils are equal, round, and reactive to light.  Neck:     Thyroid: No thyromegaly.     Trachea: No tracheal deviation.  Cardiovascular:     Rate and Rhythm: Normal rate and regular rhythm.     Pulses: Normal pulses.     Heart sounds: Normal heart sounds.  Pulmonary:     Effort: Pulmonary effort is normal. No respiratory distress.     Breath sounds: Normal breath sounds. No stridor. No wheezing, rhonchi or rales.  Abdominal:     General: Bowel sounds are normal.     Palpations: Abdomen is soft.     Tenderness: There is no abdominal tenderness.  Musculoskeletal:     Cervical back: Normal range of motion and neck supple.  Skin:    General: Skin is warm and dry.  Neurological:     General: No focal deficit present.     Mental Status: She is alert and oriented to person, place, and time.  Psychiatric:        Mood and Affect: Mood normal.        Behavior: Behavior normal.        Thought Content: Thought content normal.  Judgment: Judgment normal.      UC Treatments / Results  Labs (all labs ordered are listed, but only abnormal results are displayed) Labs  Reviewed  POCT INFLUENZA A/B - Abnormal; Notable for the following components:      Result Value   Influenza A, POC Positive (*)    All other components within normal limits    EKG   Radiology No results found.  Procedures Procedures (including critical care time)  Medications Ordered in UC Medications - No data to display  Initial Impression / Assessment and Plan / UC Course  I have reviewed the triage vital signs and the nursing notes.  Pertinent labs & imaging results that were available during my care of the patient were reviewed by me and considered in my medical decision making (see chart for details).  Influenza test was positive for influenza A.  Patient's symptoms started approximately 4 days ago, she is out of the window to begin Tamiflu unfortunately.  Symptomatic treatment provided with fluticasone  50 mcg nasal spray.  Recommended over-the-counter cough medication such as Robitussin or Mucinex (alcohol-free) as the patient is currently pregnant.  Supportive care recommendations were provided and discussed with the patient to include fluids, rest, over-the-counter Tylenol , and use of a humidifier during sleep.  Discussed indications with the patient regarding follow-up.  Patient was in agreement with this plan of care and verbalizes understanding.  All questions were answered.  Patient stable for discharge.  Final Clinical Impressions(s) / UC Diagnoses   Final diagnoses:  Influenza A     Discharge Instructions      The influenza test was positive for influenza A. Take medication as prescribed. Recommend over-the-counter Robitussin (alcohol-free) or Mucinex (plain) for the cough. You may also use cough drops as needed.  You may take over-the-counter Tylenol  as needed for pain, fever, or general discomfort. Recommend normal saline nasal spray throughout the day for nasal congestion and runny nose. For your cough, you may find it helpful to use a humidifier in your  bedroom at nighttime during sleep and sleeping elevated on pillows while symptoms persist. You do need to remain home until you have been fever free for 24 hours with no medication. Your symptoms should improve over the next 5 to 7 days.  If symptoms fail to improve, or begin to worsen, you may follow-up in this clinic or with your primary care physician for further evaluation. Follow-up as needed.     ED Prescriptions     Medication Sig Dispense Auth. Provider   fluticasone  (FLONASE ) 50 MCG/ACT nasal spray Place 2 sprays into both nostrils daily. 16 g Leath-Warren, Etta PARAS, NP      PDMP not reviewed this encounter.     [1]  Social History Tobacco Use   Smoking status: Never   Smokeless tobacco: Never  Vaping Use   Vaping status: Never Used  Substance Use Topics   Alcohol use: Not Currently    Comment: occasional   Drug use: Never     Gilmer Etta PARAS, NP 11/10/24 1511  "

## 2024-11-10 NOTE — Discharge Instructions (Addendum)
 The influenza test was positive for influenza A. Take medication as prescribed. Recommend over-the-counter Robitussin (alcohol-free) or Mucinex (plain) for the cough. You may also use cough drops as needed.  You may take over-the-counter Tylenol  as needed for pain, fever, or general discomfort. Recommend normal saline nasal spray throughout the day for nasal congestion and runny nose. For your cough, you may find it helpful to use a humidifier in your bedroom at nighttime during sleep and sleeping elevated on pillows while symptoms persist. You do need to remain home until you have been fever free for 24 hours with no medication. Your symptoms should improve over the next 5 to 7 days.  If symptoms fail to improve, or begin to worsen, you may follow-up in this clinic or with your primary care physician for further evaluation. Follow-up as needed.

## 2024-11-10 NOTE — ED Triage Notes (Signed)
 Pt reports she is fatigued, body aches, fever and cough x 4 days

## 2024-11-15 ENCOUNTER — Ambulatory Visit: Payer: Self-pay | Admitting: Women's Health

## 2024-11-15 LAB — PANORAMA PRENATAL TEST FULL PANEL:PANORAMA TEST PLUS 5 ADDITIONAL MICRODELETIONS: FETAL FRACTION: 5

## 2024-12-06 ENCOUNTER — Ambulatory Visit: Payer: Self-pay | Admitting: Women's Health

## 2024-12-06 ENCOUNTER — Encounter: Payer: Self-pay | Admitting: Women's Health

## 2024-12-06 VITALS — BP 115/72 | HR 87 | Wt 201.0 lb

## 2024-12-06 DIAGNOSIS — Z363 Encounter for antenatal screening for malformations: Secondary | ICD-10-CM

## 2024-12-06 DIAGNOSIS — Z1379 Encounter for other screening for genetic and chromosomal anomalies: Secondary | ICD-10-CM

## 2024-12-06 DIAGNOSIS — Z3A16 16 weeks gestation of pregnancy: Secondary | ICD-10-CM

## 2024-12-06 DIAGNOSIS — Z3482 Encounter for supervision of other normal pregnancy, second trimester: Secondary | ICD-10-CM

## 2024-12-06 DIAGNOSIS — O34219 Maternal care for unspecified type scar from previous cesarean delivery: Secondary | ICD-10-CM

## 2024-12-06 DIAGNOSIS — Z3480 Encounter for supervision of other normal pregnancy, unspecified trimester: Secondary | ICD-10-CM

## 2024-12-06 NOTE — Progress Notes (Signed)
 "   LOW-RISK PREGNANCY VISIT Patient name: Jill [name] MRN [id number]  Date of birth: 03-Apr-1996 Chief Complaint:   Routine Prenatal Visit  History of Present Illness:   Jill [name] is a 29 y.o. G12P1001 female at [redacted]w[redacted]d with an Estimated Date of Delivery: 05/22/25 being seen today for ongoing management of a low-risk pregnancy.   Today she reports sharp pain in head when bending over, gets a little dizzy when stepping up and down on 1 step ladder at work-discussed both, bend from knees/keep back straight/head up, slow movements, if dizzy sit down til feels better. Contractions: Not present. Vag. Bleeding: None.   . denies leaking of fluid.     11/08/2024    3:18 PM 03/22/2024    3:43 PM 08/21/2021    8:50 AM 05/11/2021   10:01 AM 04/20/2020   11:17 AM  Depression screen PHQ 2/9  Decreased Interest 0 0 0 0 0  Down, Depressed, Hopeless 0 0 0 0 0  PHQ - 2 Score 0 0 0 0 0  Altered sleeping 0 0 1 0 1  Tired, decreased energy 1 1 1 1 1   Change in appetite 1 0 0 0 0  Feeling bad or failure about yourself  0 0 0 0 0  Trouble concentrating 0 0 0 0 0  Moving slowly or fidgety/restless 0 0 0 0 0  Suicidal thoughts 0 0 0 0 0  PHQ-9 Score 2 1  2  1  2       Data saved with a previous flowsheet row definition        11/08/2024    3:18 PM 03/22/2024    3:50 PM 08/21/2021    8:50 AM 04/20/2020   11:17 AM  GAD 7 : Generalized Anxiety Score  Nervous, Anxious, on Edge 0  0  0  0   Control/stop worrying 0  0  1  0   Worry too much - different things 0  0  1  0   Trouble relaxing 0  1  0  1   Restless 0  0  0  0   Easily annoyed or irritable 1  1  1  2    Afraid - awful might happen 0  0  0  0   Total GAD 7 Score 1 2 3 3      Data saved with a previous flowsheet row definition      Review of Systems:   Pertinent items are noted in HPI Denies abnormal vaginal discharge w/ itching/odor/irritation, headaches, visual changes, shortness of breath, chest pain, abdominal pain, severe  nausea/vomiting, or problems with urination or bowel movements unless otherwise stated above. Pertinent History Reviewed:  Reviewed past medical,surgical, social, obstetrical and family history.  Reviewed problem list, medications and allergies. Physical Assessment:   Vitals:   12/06/24 1448  BP: 115/72  Pulse: 87  Weight: 201 lb (91.2 kg)  Body mass index is 36.76 kg/m.        Physical Examination:   General appearance: Well appearing, and in no distress  Mental status: Alert, oriented to person, place, and time  Skin: Warm & dry  Cardiovascular: Normal heart rate noted  Respiratory: Normal respiratory effort, no distress  Abdomen: Soft, gravid, nontender  Pelvic: Cervical exam deferred         Extremities:    Fetal Status: Fetal Heart Rate (bpm): 158        Chaperone: N/A No results found for this or any previous  visit (from the past 24 hours).  Assessment & Plan:  1) Low-risk pregnancy G2P1001 at [redacted]w[redacted]d with an Estimated Date of Delivery: 05/22/25   2) H/O C/S   Meds: No orders of the defined types were placed in this encounter.  Labs/procedures today: AFP  Plan:  Continue routine obstetrical care  Next visit: prefers will be in person for u/s    Reviewed: Preterm labor symptoms and general obstetric precautions including but not limited to vaginal bleeding, contractions, leaking of fluid and fetal movement were reviewed in detail with the patient.  All questions were answered. Does have home bp cuff. Office bp cuff given: not applicable. Check bp weekly, let us  know if consistently >140 and/or >90.  Follow-up: Return for As scheduled.  Future Appointments  Date Time Provider Department Center  01/03/2025  3:00 PM [facility] 2 [facility] None  01/03/2025  3:50 PM Cresenzo-Dishmon, Cathlean, CNM [facility]    Orders Placed This Encounter  Procedures   US  OB Comp + 14 Wk   AFP, Serum, Open Spina Bifida   Suzen JONELLE Fetters CNM, [facility] 12/06/2024 3:25 PM  "

## 2024-12-06 NOTE — Patient Instructions (Signed)
Jill [name], thank you for choosing our office today! We appreciate the opportunity to meet your healthcare needs. You may receive a short survey by mail, e-mail, or through [employer]. If you are happy with your care we would appreciate if you could take just a few minutes to complete the survey questions. We read all of your comments and take your feedback very seriously. Thank you again for choosing our office.  Center for [employer] Team at [facility] at [facility] ([address] [place], [place] [address]) Entrance C, located off of [address] Free 24/7 valet parking  Go to [email or web address] to register for FREE online childbirth classes  Call the office [phone]) or go to [facility] if: You begin to severe cramping Your water breaks.  Sometimes it is a big gush of fluid, sometimes it is just a trickle that keeps getting your panties wet or running down your legs You have vaginal bleeding.  It is normal to have a small amount of spotting if your cervix was checked.   [facility] Pediatricians/Family Doctors [facility]): [address] Colette Ribas, [phone]           [facility]: [address], [phone]                [facility]): [address], [phone] (call to ask if accepting patients) [facility] Department: [address], [place], [phone]    [facility] Pediatricians/Family Doctors [facility]): [phone] [address], [phone] [facility]: [address] [place], [phone] [facility] [place]: [address], [phone]  [facility] Doctors  [facility]): [phone] [facility]: [address], [phone]   [facility] Doctors [facility]: [address], [phone]  [facility] Doctors  [facility]: [phone], [phone]  Home Blood Pressure Monitoring for Patients   Your provider has recommended that you check your blood pressure (BP) at least once a week at home. If you do not have a blood pressure cuff at home, one will be provided for you. Contact your provider if you have not received your monitor within 1 week.   Helpful Tips for Accurate Home Blood Pressure Checks  Don't smoke, exercise, or drink caffeine 30 minutes before checking your BP Use the restroom before checking your BP (a full bladder can raise your pressure) Relax in a comfortable upright chair Feet on the ground Left arm resting comfortably on a flat surface at the level of your heart Legs uncrossed Back supported Sit quietly and don't talk Place the cuff on your bare arm Adjust snuggly, so that only two fingertips can fit between your skin and the top of the cuff Check 2 readings separated by at least one minute Keep a log of your BP readings For a visual, please reference this diagram: http://ccnc.care/bpdiagram  Provider Name: [facility]     Phone: [phone]  Zone 1: ALL CLEAR  Continue to monitor your symptoms:  BP reading is less than 140 (top number) or less than 90 (bottom number)  No right upper stomach pain No headaches or seeing spots No feeling nauseated or throwing up No swelling in face and hands  Zone 2: CAUTION Call your doctor's office for any of the following:  BP reading is greater than 140 (top number) or greater than  90 (bottom number)  Stomach pain under your ribs in the middle or right side Headaches or seeing spots Feeling nauseated or throwing up Swelling in face and hands  Zone 3: EMERGENCY  Seek immediate medical care if you have any of the following:  BP reading is greater than160 (top number) or greater than 110 (bottom number) Severe headaches not improving with Tylenol Serious difficulty catching your breath Any worsening symptoms from  Zone 2     Second Trimester of Pregnancy The second trimester is from week 14 through week 27 (months 4 through 6). The second trimester is often a time when you feel your best. Your body has adjusted to being pregnant, and you begin to feel better physically. Usually, morning sickness has lessened or quit completely, you may have more energy, and you may have an increase in appetite. The second trimester is also a time when the fetus is growing rapidly. At the end of the sixth month, the fetus is about 9 inches long and weighs about 1 pounds. You will likely begin to feel the baby move (quickening) between 16 and 20 weeks of pregnancy. Body changes during your second trimester Your body continues to go through many changes during your second trimester. The changes vary from woman to woman. Your weight will continue to increase. You will notice your lower abdomen bulging out. You may begin to get stretch marks on your hips, abdomen, and breasts. You may develop headaches that can be relieved by medicines. The medicines should be approved by your health care provider. You may urinate more often because the fetus is pressing on your bladder. You may develop or continue to have heartburn as a result of your pregnancy. You may develop constipation because certain hormones are causing the muscles that push waste through your intestines to slow down. You may develop hemorrhoids or swollen, bulging veins (varicose veins). You may have back pain. This is caused by: Weight gain. Pregnancy hormones that are relaxing the joints in your pelvis. A shift in weight and the muscles that support your balance. Your breasts will continue to grow and they will continue to become tender. Your gums may bleed and may be sensitive to brushing and flossing. Dark spots or blotches (chloasma, mask of pregnancy) may develop on your face. This will likely fade after the baby is born. A dark line from your belly button to  the pubic area (linea nigra) may appear. This will likely fade after the baby is born. You may have changes in your hair. These can include thickening of your hair, rapid growth, and changes in texture. Some women also have hair loss during or after pregnancy, or hair that feels dry or thin. Your hair will most likely return to normal after your baby is born.  What to expect at prenatal visits During a routine prenatal visit: You will be weighed to make sure you and the fetus are growing normally. Your blood pressure will be taken. Your abdomen will be measured to track your baby's growth. The fetal heartbeat will be listened to. Any test results from the previous visit will be discussed.  Your health care provider may ask you: How you are feeling. If you are feeling the baby move. If you have had any abnormal symptoms, such as leaking fluid, bleeding, severe headaches, or abdominal cramping. If you are using any tobacco products, including cigarettes, chewing tobacco, and electronic cigarettes. If you have any questions.  Other tests that may be performed during   your second trimester include: Blood tests that check for: Low iron levels (anemia). High blood sugar that affects pregnant women (gestational diabetes) between 24 and 28 weeks. Rh antibodies. This is to check for a protein on red blood cells (Rh factor). Urine tests to check for infections, diabetes, or protein in the urine. An ultrasound to confirm the proper growth and development of the baby. An amniocentesis to check for possible genetic problems. Fetal screens for spina bifida and Down syndrome. HIV (human immunodeficiency virus) testing. Routine prenatal testing includes screening for HIV, unless you choose not to have this test.  Follow these instructions at home: Medicines Follow your health care provider's instructions regarding medicine use. Specific medicines may be either safe or unsafe to take during  pregnancy. Take a prenatal vitamin that contains at least 600 micrograms (mcg) of folic acid. If you develop constipation, try taking a stool softener if your health care provider approves. Eating and drinking Eat a balanced diet that includes fresh fruits and vegetables, whole grains, good sources of protein such as meat, eggs, or tofu, and low-fat dairy. Your health care provider will help you determine the amount of weight gain that is right for you. Avoid raw meat and uncooked cheese. These carry germs that can cause birth defects in the baby. If you have low calcium intake from food, talk to your health care provider about whether you should take a daily calcium supplement. Limit foods that are high in fat and processed sugars, such as fried and sweet foods. To prevent constipation: Drink enough fluid to keep your urine clear or pale yellow. Eat foods that are high in fiber, such as fresh fruits and vegetables, whole grains, and beans. Activity Exercise only as directed by your health care provider. Most women can continue their usual exercise routine during pregnancy. Try to exercise for 30 minutes at least 5 days a week. Stop exercising if you experience uterine contractions. Avoid heavy lifting, wear low heel shoes, and practice good posture. A sexual relationship may be continued unless your health care provider directs you otherwise. Relieving pain and discomfort Wear a good support bra to prevent discomfort from breast tenderness. Take warm sitz baths to soothe any pain or discomfort caused by hemorrhoids. Use hemorrhoid cream if your health care provider approves. Rest with your legs elevated if you have leg cramps or low back pain. If you develop varicose veins, wear support hose. Elevate your feet for 15 minutes, 3-4 times a day. Limit salt in your diet. Prenatal Care Write down your questions. Take them to your prenatal visits. Keep all your prenatal visits as told by your health  care provider. This is important. Safety Wear your seat belt at all times when driving. Make a list of emergency phone numbers, including numbers for family, friends, the hospital, and police and fire departments. General instructions Ask your health care provider for a referral to a local prenatal education class. Begin classes no later than the beginning of month 6 of your pregnancy. Ask for help if you have counseling or nutritional needs during pregnancy. Your health care provider can offer advice or refer you to specialists for help with various needs. Do not use hot tubs, steam rooms, or saunas. Do not douche or use tampons or scented sanitary pads. Do not cross your legs for long periods of time. Avoid cat litter boxes and soil used by cats. These carry germs that can cause birth defects in the baby and possibly loss of the   fetus by miscarriage or stillbirth. Avoid all smoking, herbs, alcohol, and unprescribed drugs. Chemicals in these products can affect the formation and growth of the baby. Do not use any products that contain nicotine or tobacco, such as cigarettes and e-cigarettes. If you need help quitting, ask your health care provider. Visit your dentist if you have not gone yet during your pregnancy. Use a soft toothbrush to brush your teeth and be gentle when you floss. Contact a health care provider if: You have dizziness. You have mild pelvic cramps, pelvic pressure, or nagging pain in the abdominal area. You have persistent nausea, vomiting, or diarrhea. You have a bad smelling vaginal discharge. You have pain when you urinate. Get help right away if: You have a fever. You are leaking fluid from your vagina. You have spotting or bleeding from your vagina. You have severe abdominal cramping or pain. You have rapid weight gain or weight loss. You have shortness of breath with chest pain. You notice sudden or extreme swelling of your face, hands, ankles, feet, or legs. You  have not felt your baby move in over an hour. You have severe headaches that do not go away when you take medicine. You have vision changes. Summary The second trimester is from week 14 through week 27 (months 4 through 6). It is also a time when the fetus is growing rapidly. Your body goes through many changes during pregnancy. The changes vary from woman to woman. Avoid all smoking, herbs, alcohol, and unprescribed drugs. These chemicals affect the formation and growth your baby. Do not use any tobacco products, such as cigarettes, chewing tobacco, and e-cigarettes. If you need help quitting, ask your health care provider. Contact your health care provider if you have any questions. Keep all prenatal visits as told by your health care provider. This is important. This information is not intended to replace advice given to you by your health care provider. Make sure you discuss any questions you have with your health care provider. Document Released: 10/29/2001 Document Revised: 04/11/2016 Document Reviewed: 01/05/2013 Elsevier Interactive Patient Education  2017 Elsevier Inc.  

## 2024-12-08 ENCOUNTER — Ambulatory Visit: Payer: Self-pay | Admitting: Women's Health

## 2024-12-08 LAB — AFP, SERUM, OPEN SPINA BIFIDA
AFP MoM: 1.27
AFP Value: 34.6 ng/mL
Gest. Age on Collection Date: 16 wk
Maternal Age At EDD: 26.5 a
OSBR Risk 1 IN: 5251
Test Results:: NEGATIVE
Weight: 201 [lb_av]

## 2025-01-03 ENCOUNTER — Encounter: Admitting: Advanced Practice Midwife

## 2025-01-03 ENCOUNTER — Other Ambulatory Visit: Admitting: Radiology
